# Patient Record
Sex: Male | Born: 2000 | Race: Black or African American | Hispanic: No | Marital: Single | Smoking: Never smoker
Health system: Southern US, Community
[De-identification: ages and names within clinical notes are randomized; demographics above are authoritative.]

## PROBLEM LIST (undated history)

## (undated) DIAGNOSIS — Z789 Other specified health status: Secondary | ICD-10-CM

---
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
## 2024-01-04 ENCOUNTER — Ambulatory Visit (HOSPITAL_COMMUNITY)
Admission: EM | Admit: 2024-01-04 | Discharge: 2024-01-05 | Disposition: A | Discharge status: Other Acute Inpatient Hospital | Attending: Urology | Admitting: Urology

## 2024-01-04 DIAGNOSIS — R45851 Suicidal ideations: Secondary | ICD-10-CM | POA: Diagnosis not present

## 2024-01-04 DIAGNOSIS — F600 Paranoid personality disorder: Secondary | ICD-10-CM | POA: Diagnosis not present

## 2024-01-04 DIAGNOSIS — F32A Depression, unspecified: Secondary | ICD-10-CM | POA: Insufficient documentation

## 2024-01-04 DIAGNOSIS — F22 Delusional disorders: Secondary | ICD-10-CM | POA: Diagnosis not present

## 2024-01-04 DIAGNOSIS — F332 Major depressive disorder, recurrent severe without psychotic features: Secondary | ICD-10-CM | POA: Diagnosis present

## 2024-01-04 MED ORDER — HALOPERIDOL LACTATE 5 MG/ML IJ SOLN: 5.0000 mg | Freq: Three times a day (TID) | INTRAMUSCULAR | Status: DC | PRN

## 2024-01-04 MED ORDER — ACETAMINOPHEN 325 MG PO TABS: 650.0000 mg | ORAL_TABLET | Freq: Four times a day (QID) | ORAL | Status: DC | PRN

## 2024-01-04 MED ORDER — LORAZEPAM 2 MG/ML IJ SOLN: 2.0000 mg | Freq: Three times a day (TID) | INTRAMUSCULAR | Status: DC | PRN

## 2024-01-04 MED ORDER — HALOPERIDOL LACTATE 5 MG/ML IJ SOLN: 10.0000 mg | Freq: Three times a day (TID) | INTRAMUSCULAR | Status: DC | PRN

## 2024-01-04 MED ORDER — ALUM & MAG HYDROXIDE-SIMETH 200-200-20 MG/5ML PO SUSP: 30.0000 mL | ORAL | Status: DC | PRN

## 2024-01-04 MED ORDER — DIPHENHYDRAMINE HCL 50 MG/ML IJ SOLN: 50.0000 mg | Freq: Three times a day (TID) | INTRAMUSCULAR | Status: DC | PRN

## 2024-01-04 MED ORDER — DIPHENHYDRAMINE HCL 50 MG PO CAPS: 50.0000 mg | ORAL_CAPSULE | Freq: Three times a day (TID) | ORAL | Status: DC | PRN

## 2024-01-04 MED ORDER — HALOPERIDOL 5 MG PO TABS: 5.0000 mg | ORAL_TABLET | Freq: Three times a day (TID) | ORAL | Status: DC | PRN

## 2024-01-04 MED ORDER — MAGNESIUM HYDROXIDE 400 MG/5ML PO SUSP: 30.0000 mL | Freq: Every day | ORAL | Status: DC | PRN

## 2024-01-04 MED ORDER — TRAZODONE HCL 50 MG PO TABS: 50.0000 mg | ORAL_TABLET | Freq: Every evening | ORAL | Status: DC | PRN

## 2024-01-04 NOTE — ED Notes (Signed)
 Pt is currently sleeping, no distress noted, environmental check complete, will continue to monitor patient for safety.

## 2024-01-04 NOTE — BH Assessment (Signed)
 Comprehensive Clinical Assessment (CCA) Note  01/04/2024 Antonio Dunn [id number]  Chief Complaint:  Chief Complaint  Patient presents with   Depression   Paranoid   Disposition: Per Antonio Ajibola,NP patient is recommended for inpatient admission.  The patient demonstrates the following risk factors for suicide: Chronic risk factors for suicide include: psychiatric disorder of MDD . Acute risk factors for suicide include: family or marital conflict, social withdrawal/isolation, and loss (financial, interpersonal, professional). Protective factors for this patient include: hope for the future. Considering these factors, the overall suicide risk at this point appears to be low. Patient is not appropriate for outpatient follow up.   Antonio Dunn is a 23 year old male who presents to [facility] voluntarily accompanied by her father Antonio Dunn [phone]. Patient reports paranoia feeling like people are watching him, increased depression symptoms and agitation. His father states his friends contacted him due to concerns about the patient's mental health. He states he was sending angry messages, venting to his friends. His father states that the messages may have mentioned passive SI. Patient reports passive SI but denies plans or intent to harm himself. He reports issues related to his mother treating him like a child. He states he was fired from his job, at [employer] office in his apartment, 1 month ago. His father reported a recent break up, but the patient did not want to elaborate on this subject. His father reports a decline in the patient's hygiene, increased irritability, and he has been hyper focusing on rap artists.He reports daily use of marijuana prior to this week, but cannot specify the amount he was using daily. He reports occasional use of alcohol, last occurrence was roughly around February per his report. He reports he was previously seeing a therapist and psychiatrist at his school [employer] and was  prescribed medication but cannot recall the last time he took the medication.   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. 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 He identifies his primary support system as his father. Patient reports isolation, crying spells, irritability, hopelessness, guilt, loss of interest to do things they enjoy, fatigue, lack of concentration,lack of motivation, worthlessness, decreased sleep, decreased appetite.Patient denies past inpatient admissions. Patient reports upcoming court date for his suspended license but did not elaborate further. Patient denies access to weapons.He denies past suicide attempts. Patient denies NSSIB, HI, and AVH   Treatment options were discussed and patient is in agreement with recommendation for inpatient admission.   Visit Diagnosis: MDD    CCA Screening, Triage and Referral (STR)  Patient Reported Information How did you hear about [place]? Family/Friend  What Is the Reason for Your Visit/Call Today? Antonio Dunn is a 23 year old male who presents to [facility] voluntarily accompanied by her father Antonio Dunn [phone]. Patient reports paranoia feeling like people are watching him, increased depression symptoms and agitation. His father states his friends contacted him due to concerns about the patients mental health. He states he was sending angry messages,venting to his friends. His father states that the messages may have mentioned passive SI. Patient reports passive SI but denies plans or intent to harm himself. Pt denies HI and AVH.  How Long Has This Been Causing You Problems? 1 wk - 1 month  What Do You Feel Would Help You the Most Today? Medication(s); Treatment for Depression or other mood problem; Stress Management   Have You Recently Had Any Thoughts About Hurting Yourself? Yes  Are You  Planning to Commit Suicide/Harm Yourself At This time? No   Flowsheet Row ED from 01/04/2024 in  [facility]  C-SSRS RISK CATEGORY Low Risk       Have you Recently Had Thoughts About Hurting Someone [name]? No  Are You Planning to Harm Someone at This Time? No  Explanation: denies HI   Have You Used Any Alcohol or Drugs in the Past 24 Hours? No  How Long Ago Did You Use Drugs or Alcohol? unknown What Did You Use and How Much? denies use in the past 24 hours   Do You Currently Have a Therapist/Psychiatrist? No  Name of Therapist/Psychiatrist:    Have You Been Recently Discharged From Any Office Practice or Programs? No  Explanation of Discharge From Practice/Program: n/a    CCA Screening Triage Referral Assessment Type of Contact: Face-to-Face  Telemedicine Service Delivery:   Is this Initial or Reassessment?   Date Telepsych consult ordered in [facility]:    Time Telepsych consult ordered in [facility]:    Location of Assessment: [facility] Assessment Services  Provider Location: [facility] Assessment Services   Collateral Involvement: Antonio Dunn, Antonio Dunn (Father)  [phone]   Does Patient Have a Court Appointed Legal Guardian? No  Legal Guardian Contact Information: n/a  Copy of Legal Guardianship Form: -- (n/a)  Legal Guardian Notified of Arrival: -- (n/a)  Legal Guardian Notified of Pending Discharge: -- (n/a)  If Minor and Not Living with Parent(s), Who has Custody? n/a  Is CPS involved or ever been involved? Never  Is APS involved or ever been involved? Never   Patient Determined To Be At Risk for Harm To Self or Others Based on Review of Patient Reported Information or Presenting Complaint? Yes, for Self-Harm  Method: No Plan  Availability of Means: No access or NA  Intent: Vague intent or NA  Notification Required: No need or identified person  Additional Information for Danger to Others Potential: -- (n/a)  Additional Comments for Danger to Others Potential: denies HI  Are There Guns or Other Weapons in Your Home? No  Types of  Guns/Weapons: denies access to weapons  Are These Weapons Safely Secured?                            -- (denies access to weapons)  Who Could Verify You Are Able To Have These Secured: n/a  Do You Have any Outstanding Charges, Pending Court Dates, Parole/Probation? reports upcoming court date for suspended license  Contacted To Inform of Risk of Harm To Self or Others: Family/Significant Other:    Does Patient Present under Involuntary Commitment? No    County of Residence: Other (Comment) (lives in [place])   Patient Currently Receiving the Following Services: Not Receiving Services   Determination of Need: Urgent (48 hours)   Options For Referral: Inpatient Hospitalization     CCA Biopsychosocial Patient Reported Schizophrenia/Schizoaffective Diagnosis in Past: No   Strengths: Seeking treatment   Mental Health Symptoms Depression:  Difficulty Concentrating; Fatigue; Hopelessness; Increase/decrease in appetite; Irritability; Sleep (too much or little); Tearfulness; Worthlessness; Change in energy/activity   Duration of Depressive symptoms: Duration of Depressive Symptoms: Greater than two weeks   Mania:  Irritability   Anxiety:   Tension; Worrying; Sleep   Psychosis:  None   Duration of Psychotic symptoms:    Trauma:  N/A   Obsessions:  N/A   Compulsions:  N/A   Inattention:  N/A   Hyperactivity/Impulsivity:  N/A   Oppositional/Defiant Behaviors:  N/A   Emotional Irregularity:  Mood lability; Transient, stress-related paranoia/disassociation; Intense/inappropriate anger   Other Mood/Personality Symptoms:  none reported    Mental Status Exam Appearance and self-care  Stature:  Average   Weight:  Average weight   Clothing:  Casual   Grooming:  Normal   Cosmetic use:  None   Posture/gait:  Stooped; Tense   Motor activity:  Not Remarkable   Sensorium  Attention:  Normal   Concentration:  Variable   Orientation:  Person; Place;  Situation   Recall/memory:  Normal   Affect and Mood  Affect:  Depressed; Anxious   Mood:  Irritable; Depressed   Relating  Eye contact:  Avoided   Facial expression:  Angry; Tense   Attitude toward examiner:  Guarded; Resistant   Thought and Language  Speech flow: Clear and Coherent   Thought content:  Appropriate to Mood and Circumstances   Preoccupation:  None   Hallucinations:  None   Organization:  Goal-directed   Company secretary of Knowledge:  Average   Intelligence:  Average   Abstraction:  Normal   Judgement:  Impaired   Reality Testing:  Variable   Insight:  Fair   Decision Making:  Impulsive   Social Functioning  Social Maturity:  Isolates   Social Judgement:  Normal   Stress  Stressors:  Family conflict; Relationship; Transitions   Coping Ability:  Exhausted; Overwhelmed   Skill Deficits:  Interpersonal; Self-control   Supports:  Family; Support needed     Religion: Religion/Spirituality Are You A Religious Person?:  (unknown) How Might This Affect Treatment?: UTA  Leisure/Recreation: Leisure / Recreation Do You Have Hobbies?:  (UTA)  Exercise/Diet: Exercise/Diet Do You Exercise?:  (UTA) Have You Gained or Lost A Significant Amount of Weight in the Past Six Months?:  (UTA) Do You Follow a Special Diet?:  (UTA) Do You Have Any Trouble Sleeping?: Yes Explanation of Sleeping Difficulties: reports decreased sleep, reporting not sleeping much at all but cannot quantify the amount of hours per night   CCA Employment/Education Employment/Work Situation: Employment / Work Situation Employment Situation: Employed 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 by Current Illness: Yes Describe how Patient's Job has Been Impacted: reports losing a job 1 month ago Has Patient ever Been in Equities trader?: No  Education: Education Is Patient Currently Attending School?: Yes School Currently Attending: [employer] Last Grade Completed: 12 Did You Product manager?: Yes What Type of College Degree Do you Have?: currently at [employer] Did You Have An Individualized Education Program (IIEP):  (UTA) Did You Have Any Difficulty At School?:  (UTA) Patient's Education Has Been Impacted by Current Illness:  (UTA)   CCA Family/Childhood History Family and Relationship History: Family history Marital status: Single Does patient have children?:  (UTA)  Childhood History:  Childhood History By whom was/is the patient raised?: Father Did patient suffer any verbal/emotional/physical/sexual abuse as a child?: No Did patient suffer from severe childhood neglect?: No Has patient ever been sexually abused/assaulted/raped as an adolescent or adult?: No Was the patient ever a victim of a crime or a disaster?: No Witnessed domestic violence?: No Has patient been affected by domestic violence as an adult?: No       CCA Substance Use Alcohol/Drug Use: Alcohol / Drug Use Pain Medications: UTA Prescriptions: UTA Over the Counter: UTA History of alcohol / drug use?: Yes Longest period of sobriety (when/how long): UTA, reports marijuana use and  occasional alcohol use.                         ASAM's:  Six Dimensions of Multidimensional Assessment  Dimension 1:  Acute Intoxication and/or Withdrawal Potential:      Dimension 2:  Biomedical Conditions and Complications:      Dimension 3:  Emotional, Behavioral, or Cognitive Conditions and Complications:     Dimension 4:  Readiness to Change:     Dimension 5:  Relapse, Continued use, or Continued Problem Potential:     Dimension 6:  Recovery/Living Environment:     ASAM Severity Score:    ASAM Recommended Level of Treatment:     Substance use Disorder (SUD)    Recommendations for Services/Supports/Treatments:    Disposition Recommendation per psychiatric provider: We recommend inpatient psychiatric hospitalization when  medically cleared. Patient is under voluntary admission status at this time; please IVC if attempts to leave hospital.   DSM5 Diagnoses: There are no active problems to display for this patient.    Referrals to Alternative Service(s): Referred to Alternative Service(s):   Place:   Date:   Time:    Referred to Alternative Service(s):   Place:   Date:   Time:    Referred to Alternative Service(s):   Place:   Date:   Time:    Referred to Alternative Service(s):   Place:   Date:   Time:     Loma Newton, [facility]

## 2024-01-04 NOTE — ED Provider Notes (Signed)
 [facility] Continuous Assessment Admission H&P  Date: 01/04/24 Patient Name: Antonio Dunn MRN: [id number] Chief Complaint: "depression and passive suicidal thoughts"  Diagnoses:  Final diagnoses:  None    HPI: Antonio Dunn is a 23y/o male with documented history of depression and ADHD. Patient presented voluntarily to [facility] for the evaluation of worsening depressive symptoms. He is accompanied by his father, Kingston Shawgo. Patient gave verbal consent for his father to participate in his assessment.   Patient was evaluated face to face and his chart was reviewed by this NP.  On assessment, patient acknowledges increased irritability and emotional distress. The father reports the patient has been acting out of character, with recent reports of sending angry text messages, acting paranoid, passive suicidal ideation, and thoughts of hurting others. Father states "he doesn't care if he wakes up."  Patient admits to suicidal thoughts but denies any intent or plan to harm himself. Key stressors include a recent breakup, being fired from his job, and strained relations with his mother. The father notes a decline in hygiene, increased irritability, and an obsessive focus on rap artists. The patient reports daily marijuana use up until early this week and occasional alcohol use, with his last drink occurring in mid-February.  Patient reports he is experiencing symptoms of depression, including isolation, crying spells, hopelessness, guilt, loss of interest in activities, fatigue, lack of concentration, worthlessness, decreased sleep, and appetite changes. He says he is currently not seeing a therapist or psychiatrist but was previously prescribed Lexapro and Vyvanse while during his undergrad years at [employer], although he cannot recall the last time he took the medications.  On assessment, the patient was cooperative but irritable, with a restricted affect congruent with his mood. His thought process was  coherent, and he endorsed passive suicidal ideation without plan or intent. He is oriented X4. He reports difficulties with concentration and motivation. His insight is faired, and his judgment is impaired. He denies hallucination. He admits to feeling paranoid- stating "I feel like I'm been watched every where in go." No evidence of delusional thinking noted.    Total Time spent with patient: 30 minutes  Musculoskeletal  Strength & Muscle Tone: within normal limits Gait & Station: normal Patient leans: Right  Psychiatric Specialty Exam  Presentation General Appearance:  Appropriate for Environment  Eye Contact: Fair  Speech: Clear and Coherent  Speech Volume: Decreased  Handedness: Right   Mood and Affect  Mood: Depressed  Affect: Congruent   Thought Process  Thought Processes: Coherent  Descriptions of Associations:Intact  Orientation:Full (Time, Place and Person)  Thought Content:WDL    Hallucinations:Hallucinations: None  Ideas of Reference:None  Suicidal Thoughts:Suicidal Thoughts: Yes, Passive SI Passive Intent and/or Plan: Without Plan  Homicidal Thoughts:Homicidal Thoughts: No   Sensorium  Memory: Immediate Good; Remote Good; Recent Good  Judgment: Fair  Insight: Fair   Art therapist  Concentration: Good  Attention Span: Good  Recall: Good  Fund of Knowledge: Good  Language: Good   Psychomotor Activity  Psychomotor Activity: Psychomotor Activity: Normal   Assets  Assets: Desire for Improvement; Housing; Social Support; Physical Health   Sleep  Sleep: Sleep: Poor ("just a few hours, i dont sleep") Number of Hours of Sleep: 0 ("just a few hours" pt unable to quantify)   Nutritional Assessment (For OBS and FBC admissions only) Has the patient had a weight loss or gain of 10 pounds or more in the last 3 months?: No Has the patient had a decrease in food intake/or  appetite?: Yes Does the patient have dental  problems?: No Does the patient have eating habits or behaviors that may be indicators of an eating disorder including binging or inducing vomiting?: No Has the patient recently lost weight without trying?: 0 Has the patient been eating poorly because of a decreased appetite?: 0 Malnutrition Screening Tool Score: 0    Physical Exam Vitals and nursing note reviewed.  Constitutional:      General: He is not in acute distress.    Appearance: Normal appearance. He is well-developed. He is not ill-appearing.  HENT:     Head: Normocephalic and atraumatic.  Eyes:     Conjunctiva/sclera: Conjunctivae normal.  Cardiovascular:     Rate and Rhythm: Tachycardia present.     Heart sounds: No murmur heard. Pulmonary:     Effort: Pulmonary effort is normal. No respiratory distress.  Abdominal:     Palpations: Abdomen is soft.     Tenderness: There is no abdominal tenderness.  Musculoskeletal:        General: Normal range of motion.     Cervical back: Neck supple.  Neurological:     Mental Status: He is alert and oriented to person, place, and time.  Psychiatric:        Attention and Perception: Attention and perception normal.        Mood and Affect: Affect normal. Mood is depressed.        Speech: Speech normal.        Behavior: Behavior is cooperative.        Thought Content: Thought content is paranoid. Thought content is not delusional. Thought content includes suicidal ideation. Thought content does not include suicidal plan.    Review of Systems  Constitutional: Negative.   HENT: Negative.    Eyes: Negative.   Respiratory: Negative.    Cardiovascular: Negative.   Gastrointestinal: Negative.   Genitourinary: Negative.   Musculoskeletal: Negative.   Skin: Negative.   Neurological: Negative.   Endo/Heme/Allergies: Negative.   Psychiatric/Behavioral:  Positive for depression, substance abuse and suicidal ideas.     Blood pressure 131/87, pulse (!) 113, temperature 97.8 F (36.6  C), temperature source Oral, resp. rate 18, SpO2 100%. There is no height or weight on file to calculate BMI.  Past Psychiatric History: Depression ADHD   Is the patient at risk to self? No  Has the patient been a risk to self in the past 6 months? No .    Has the patient been a risk to self within the distant past? No   Is the patient a risk to others? No   Has the patient been a risk to others in the past 6 months? No   Has the patient been a risk to others within the distant past? No   Past Medical History: tonsillitis, periorbital cellulist,   Family History: None reported  Social History:  Social History   Tobacco Use   Smoking status: Never   Smokeless tobacco: Never  Substance Use Topics   Alcohol use: Not Currently   Drug use: Not Currently     Last Labs:  No visits with results within 6 Month(s) from this visit.  Latest known visit with results is:  Hospital Outpatient Visit on 06/09/2009  Component Date Value Ref Range Status   WBC 06/09/2009 9.4  4.5 - 13.5 K/uL Final   RBC 06/09/2009 4.65  3.80 - 5.20 MIL/uL Final   Hemoglobin 06/09/2009 13.3  11.0 - 14.6 g/dL Final   HCT 40/98/1191  39.8  33.0 - 44.0 % Final   MCV 06/09/2009 85.5  77.0 - 95.0 fL Final   MCHC 06/09/2009 33.5  31.0 - 37.0 g/dL Final   RDW 97/94/8016 13.4  11.3 - 15.5 % Final   Platelets 06/09/2009 340  150 - 400 K/uL Final   Sodium 06/09/2009 134 (L)  135 - 145 mEq/L Final   Potassium 06/09/2009 3.9  3.5 - 5.1 mEq/L Final   Chloride 06/09/2009 101  96 - 112 mEq/L Final   CO2 06/09/2009 27  19 - 32 mEq/L Final   Glucose, Bld 06/09/2009 88  70 - 99 mg/dL Final   BUN 55/37/4827 12  6 - 23 mg/dL Final   Creatinine, Ser 06/09/2009 0.42  0.4 - 1.5 mg/dL Final   Calcium 07/86/7544 9.2  8.4 - 10.5 mg/dL Final   Total Protein 92/10/69 7.2  6.0 - 8.3 g/dL Final   Albumin 21/97/5883 4.1  3.5 - 5.2 g/dL Final   AST 25/49/8264 36  0 - 37 U/L Final   ALT 06/09/2009 44  0 - 53 U/L Final   Alkaline  Phosphatase 06/09/2009 376 (H)  42 - 362 U/L Final   Total Bilirubin 06/09/2009 0.4  0.3 - 1.2 mg/dL Final   GFR calc non Af Amer 06/09/2009 NOT CALCULATED  >60 mL/min Final   GFR calc Af Amer 06/09/2009   >60 mL/min Final                   Value:NOT CALCULATED                                The eGFR has been calculated                         using the MDRD equation.                         This calculation has not been                         validated in all clinical                         situations.                         eGFR's persistently                         <60 mL/min signify                         possible Chronic Kidney Disease.   Neutrophils Relative % 06/09/2009 64  33 - 67 % Final   Lymphocytes Relative 06/09/2009 21 (L)  31 - 63 % Final   Monocytes Relative 06/09/2009 9  3 - 11 % Final   Eosinophils Relative 06/09/2009 5  0 - 5 % Final   Basophils Relative 06/09/2009 1  0 - 1 % Final   Neutro Abs 06/09/2009 6.0  1.5 - 8.0 K/uL Final   Lymphs Abs 06/09/2009 2.0  1.5 - 7.5 K/uL Final   Monocytes Absolute 06/09/2009 0.8  0.2 - 1.2 K/uL Final   Eosinophils Absolute 06/09/2009 0.5  0.0 - 1.2 K/uL Final   Basophils Absolute  06/09/2009 0.1  0.0 - 0.1 K/uL Final   Smear Review 06/09/2009 MORPHOLOGY UNREMARKABLE   Final   Lipase 06/09/2009 18  11 - 59 U/L Final    Allergies: Patient has no known allergies.  Medications:  Facility Ordered Medications  Medication   acetaminophen (TYLENOL) tablet 650 mg   alum & mag hydroxide-simeth (MAALOX/MYLANTA) 200-200-20 MG/5ML suspension 30 mL   magnesium hydroxide (MILK OF MAGNESIA) suspension 30 mL   haloperidol lactate (HALDOL) injection 5 mg   And   diphenhydrAMINE (BENADRYL) injection 50 mg   And   LORazepam (ATIVAN) injection 2 mg   haloperidol (HALDOL) tablet 5 mg   And   diphenhydrAMINE (BENADRYL) capsule 50 mg   haloperidol lactate (HALDOL) injection 10 mg   And   diphenhydrAMINE (BENADRYL) injection 50 mg   And    LORazepam (ATIVAN) injection 2 mg   traZODone (DESYREL) tablet 50 mg   PTA Medications  Medication Sig   ibuprofen (ADVIL,MOTRIN) 600 MG tablet Take 1 tablet (600 mg total) by mouth every 6 (six) hours as needed.      Medical Decision Making  Patient is recommended for inpatient treatment for stabilization and safety.     Recommendations  Based on my evaluation the patient does not appear to have an emergency medical condition.  Maricela Bo, NP 01/04/24  9:29 PM

## 2024-01-04 NOTE — Progress Notes (Signed)
   01/04/24 1821  [facility] Triage Screening (Walk-ins at [facility] only)  How Did You Hear About [place]? Self  What Is the Reason for Your Visit/Call Today? Pt is a 23 yo male who presents to [facility] voluntarily accompanied by his father Kandee Keen [phone]). Pt currently lives in [place], [place] but drove to his father today so that he can be evaluated due to fahter's concerns about pt's  mental health. Pt present paranoid and shared that he recently got fired from his job because he became uncomfortable working there. Pt reports seeing "unfamiliar faces". Pt reports that he has been diagnosed with depression in the past and was prescribed medications. Pt reports that he stopped recently because he didn't like how it was making him feel. Pt reports passive SI and not wanting to live but denies a plan. Pt's father expressed concern about pt's behaviors and stated that pt's friends have reached out to him about pt's SI comments. Pt reports that he currently smokes marijuana but has cut back. Pt was unable to recall how much he smokes.  How Long Has This Been Causing You Problems? > than 6 months  Have You Recently Had Any Thoughts About Hurting Yourself? Yes  How long ago did you have thoughts about hurting yourself? "all my life"  Are You Planning to Commit Suicide/Harm Yourself At This time? No  Have you Recently Had Thoughts About Hurting Someone [name]? No  Are You Planning To Harm Someone At This Time? No  Physical Abuse Denies  Verbal Abuse Denies  Sexual Abuse Denies  Exploitation of patient/patient's resources Denies  Self-Neglect Denies  Possible abuse reported to:  (n/a)  Are you currently experiencing any auditory, visual or other hallucinations? Yes  Please explain the hallucinations you are currently experiencing: " seeing unfamilar faces"  Have You Used Any Alcohol or Drugs in the Past 24 Hours? Yes  What Did You Use and How Much? marijuana  Do you have any current medical co-morbidities that  require immediate attention? No  Clinician description of patient physical appearance/behavior: Pt was cooperative  What Do You Feel Would Help You the Most Today? Medication(s);Treatment for Depression or other mood problem;Stress Management  If access to [facility] was not available, would you have sought care in the Emergency Department? Yes  Determination of Need Urgent (48 hours)  Options For Referral Inpatient Hospitalization  Determination of Need filed? Yes    Flowsheet Row ED from 01/04/2024 in [facility]  C-SSRS RISK CATEGORY Low Risk

## 2024-01-05 ENCOUNTER — Encounter (HOSPITAL_COMMUNITY): Payer: Self-pay | Admitting: Urology

## 2024-01-05 ENCOUNTER — Other Ambulatory Visit: Payer: Self-pay

## 2024-01-05 ENCOUNTER — Inpatient Hospital Stay (HOSPITAL_COMMUNITY)
Admission: AD | Admit: 2024-01-05 | Discharge: 2024-01-08 | DRG: 885 | Disposition: A | Discharge status: Home / Self Care | Source: Intra-hospital | Attending: Psychiatry | Admitting: Psychiatry

## 2024-01-05 DIAGNOSIS — Z5986 Financial insecurity: Secondary | ICD-10-CM

## 2024-01-05 DIAGNOSIS — F333 Major depressive disorder, recurrent, severe with psychotic symptoms: Secondary | ICD-10-CM | POA: Diagnosis present

## 2024-01-05 DIAGNOSIS — F419 Anxiety disorder, unspecified: Secondary | ICD-10-CM | POA: Insufficient documentation

## 2024-01-05 DIAGNOSIS — L309 Dermatitis, unspecified: Secondary | ICD-10-CM | POA: Diagnosis present

## 2024-01-05 DIAGNOSIS — Z91199 Patient's noncompliance with other medical treatment and regimen due to unspecified reason: Secondary | ICD-10-CM

## 2024-01-05 DIAGNOSIS — F909 Attention-deficit hyperactivity disorder, unspecified type: Secondary | ICD-10-CM | POA: Diagnosis present

## 2024-01-05 DIAGNOSIS — Z79899 Other long term (current) drug therapy: Secondary | ICD-10-CM

## 2024-01-05 HISTORY — DX: Other specified health status: Z78.9

## 2024-01-05 LAB — CBC WITH DIFFERENTIAL/PLATELET
Abs Immature Granulocytes: 0.04 10*3/uL (ref 0.00–0.07)
Basophils Absolute: 0 10*3/uL (ref 0.0–0.1)
Basophils Relative: 0 %
Eosinophils Absolute: 0.2 10*3/uL (ref 0.0–0.5)
Eosinophils Relative: 2 %
HCT: 46.4 % (ref 39.0–52.0)
Hemoglobin: 16.4 g/dL (ref 13.0–17.0)
Immature Granulocytes: 0 %
Lymphocytes Relative: 24 %
Lymphs Abs: 2.3 10*3/uL (ref 0.7–4.0)
MCH: 29.9 pg (ref 26.0–34.0)
MCHC: 35.3 g/dL (ref 30.0–36.0)
MCV: 84.7 fL (ref 80.0–100.0)
Monocytes Absolute: 0.8 10*3/uL (ref 0.1–1.0)
Monocytes Relative: 9 %
Neutro Abs: 6.1 10*3/uL (ref 1.7–7.7)
Neutrophils Relative %: 65 %
Platelets: 362 10*3/uL (ref 150–400)
RBC: 5.48 MIL/uL (ref 4.22–5.81)
RDW: 12.1 % (ref 11.5–15.5)
WBC: 9.5 10*3/uL (ref 4.0–10.5)
nRBC: 0 % (ref 0.0–0.2)

## 2024-01-05 LAB — LIPID PANEL
Cholesterol: 132 mg/dL (ref 0–200)
HDL: 49 mg/dL (ref >40)
LDL Cholesterol: 75 mg/dL (ref 0–99)
Total CHOL/HDL Ratio: 2.7 ratio
Triglycerides: 42 mg/dL (ref <150)
VLDL: 8 mg/dL (ref 0–40)

## 2024-01-05 LAB — COMPREHENSIVE METABOLIC PANEL WITH GFR
ALT: 31 U/L (ref 0–44)
AST: 33 U/L (ref 15–41)
Albumin: 4.4 g/dL (ref 3.5–5.0)
Alkaline Phosphatase: 79 U/L (ref 38–126)
Anion gap: 11 (ref 5–15)
BUN: 7 mg/dL (ref 6–20)
CO2: 29 mmol/L (ref 22–32)
Calcium: 9.7 mg/dL (ref 8.9–10.3)
Chloride: 98 mmol/L (ref 98–111)
Creatinine, Ser: 1.25 mg/dL — ABNORMAL HIGH (ref 0.61–1.24)
GFR, Estimated: >60 mL/min (ref >60)
Glucose, Bld: 77 mg/dL (ref 70–99)
Potassium: 3.7 mmol/L (ref 3.5–5.1)
Sodium: 138 mmol/L (ref 135–145)
Total Bilirubin: 0.8 mg/dL (ref 0.0–1.2)
Total Protein: 7.7 g/dL (ref 6.5–8.1)

## 2024-01-05 LAB — ETHANOL: Alcohol, Ethyl (B): <10 mg/dL (ref <10)

## 2024-01-05 LAB — HEMOGLOBIN A1C
Hgb A1c MFr Bld: 5 % (ref 4.8–5.6)
Mean Plasma Glucose: 96.8 mg/dL

## 2024-01-05 MED ORDER — INFLUENZA VIRUS VACC SPLIT PF (FLUZONE) 0.5 ML IM SUSY
0.5000 mL | PREFILLED_SYRINGE | INTRAMUSCULAR | Status: DC
  Filled 2024-01-05: qty 0.5

## 2024-01-05 MED ORDER — HALOPERIDOL LACTATE 5 MG/ML IJ SOLN: 5.0000 mg | Freq: Three times a day (TID) | INTRAMUSCULAR | Status: DC | PRN

## 2024-01-05 MED ORDER — FLUOXETINE HCL 10 MG PO CAPS
10.0000 mg | ORAL_CAPSULE | Freq: Every day | ORAL | Status: AC
  Administered 2024-01-05: 10 mg via ORAL
  Filled 2024-01-05 (×2): qty 1

## 2024-01-05 MED ORDER — ACETAMINOPHEN 325 MG PO TABS
650.0000 mg | ORAL_TABLET | Freq: Four times a day (QID) | ORAL | Status: DC | PRN
  Administered 2024-01-06 – 2024-01-08 (×4): 650 mg via ORAL
  Filled 2024-01-05 (×5): qty 2

## 2024-01-05 MED ORDER — MAGNESIUM HYDROXIDE 400 MG/5ML PO SUSP: 30.0000 mL | Freq: Every day | ORAL | Status: DC | PRN

## 2024-01-05 MED ORDER — HYDROXYZINE HCL 25 MG PO TABS: 25.0000 mg | ORAL_TABLET | Freq: Three times a day (TID) | ORAL | Status: DC | PRN

## 2024-01-05 MED ORDER — DIPHENHYDRAMINE HCL 50 MG/ML IJ SOLN: 50.0000 mg | Freq: Three times a day (TID) | INTRAMUSCULAR | Status: DC | PRN

## 2024-01-05 MED ORDER — ALUM & MAG HYDROXIDE-SIMETH 200-200-20 MG/5ML PO SUSP: 30.0000 mL | ORAL | Status: DC | PRN

## 2024-01-05 MED ORDER — DIPHENHYDRAMINE HCL 25 MG PO CAPS: 50.0000 mg | ORAL_CAPSULE | Freq: Three times a day (TID) | ORAL | Status: DC | PRN

## 2024-01-05 MED ORDER — LORAZEPAM 2 MG/ML IJ SOLN: 2.0000 mg | Freq: Three times a day (TID) | INTRAMUSCULAR | Status: DC | PRN

## 2024-01-05 MED ORDER — ARIPIPRAZOLE 5 MG PO TABS
5.0000 mg | ORAL_TABLET | Freq: Every day | ORAL | Status: DC
  Administered 2024-01-05 – 2024-01-08 (×4): 5 mg via ORAL
  Filled 2024-01-05 (×7): qty 1

## 2024-01-05 MED ORDER — HALOPERIDOL 5 MG PO TABS: 5.0000 mg | ORAL_TABLET | Freq: Three times a day (TID) | ORAL | Status: DC | PRN

## 2024-01-05 MED ORDER — FLUOXETINE HCL 20 MG PO CAPS
20.0000 mg | ORAL_CAPSULE | Freq: Every day | ORAL | Status: AC
  Administered 2024-01-06: 20 mg via ORAL
  Filled 2024-01-05: qty 1

## 2024-01-05 MED ORDER — TRAZODONE HCL 50 MG PO TABS
50.0000 mg | ORAL_TABLET | Freq: Every evening | ORAL | Status: DC | PRN
  Administered 2024-01-05 – 2024-01-07 (×3): 50 mg via ORAL
  Filled 2024-01-05 (×3): qty 1

## 2024-01-05 MED ORDER — NICOTINE POLACRILEX 2 MG MT GUM
2.0000 mg | CHEWING_GUM | OROMUCOSAL | Status: DC | PRN
  Administered 2024-01-05 – 2024-01-08 (×9): 2 mg via ORAL
  Filled 2024-01-05: qty 1

## 2024-01-05 MED ORDER — HALOPERIDOL LACTATE 5 MG/ML IJ SOLN: 10.0000 mg | Freq: Three times a day (TID) | INTRAMUSCULAR | Status: DC | PRN

## 2024-01-05 NOTE — [facility]HH Group Notes (Signed)
 Adult Psychoeducational Group Note  Date:  01/05/2024 Time:  9:16 PM  Group Topic/Focus:  Wrap-Up Group:   The focus of this group is to help patients review their daily goal of treatment and discuss progress on daily workbooks.  Participation Level:  Active  Participation Quality:  Appropriate  Affect:  Appropriate  Cognitive:  Appropriate  Insight: Appropriate  Engagement in Group:  Engaged  Modes of Intervention:  Discussion  Additional Comments:  Pt attended group.  Joselyn Arrow 01/05/2024, 9:16 PM

## 2024-01-05 NOTE — Progress Notes (Signed)
   01/05/24 0900  Psych Admission Type (Psych Patients Only)  Admission Status Voluntary  Psychosocial Assessment  Patient Complaints Depression;Irritability  Eye Contact Fair  Facial Expression Flat  Affect Irritable  Speech Logical/coherent  Interaction Assertive  Motor Activity Other (Comment) (WNL)  Appearance/Hygiene In scrubs  Behavior Characteristics Anxious  Mood Depressed  Thought Process  Coherency WDL  Content WDL  Delusions None reported or observed  Perception WDL  Hallucination None reported or observed  Judgment Limited  Confusion None  Danger to Self  Current suicidal ideation? Denies  Description of Suicide Plan n  Agreement Not to Harm Self Yes  Description of Agreement verbal  Danger to Others  Danger to Others None reported or observed

## 2024-01-05 NOTE — Tx Team (Signed)
 Initial Treatment Plan 01/05/2024 4:44 AM Antonio Dunn [id number]    PATIENT STRESSORS: Financial difficulties   Marital or family conflict     PATIENT STRENGTHS: Average or above average intelligence  General fund of knowledge  Supportive family/friends    PATIENT IDENTIFIED PROBLEMS: Recent Breakup  Job Loss  School                 DISCHARGE CRITERIA:  Ability to meet basic life and health needs Improved stabilization in mood, thinking, and/or behavior Need for constant or close observation no longer present Verbal commitment to aftercare and medication compliance  PRELIMINARY DISCHARGE PLAN: Outpatient therapy Return to previous living arrangement Return to previous work or school arrangements  PATIENT/FAMILY INVOLVEMENT: This treatment plan has been presented to and reviewed with the patient, Antonio Dunn. The patient and family have been given the opportunity to ask questions and make suggestions.  Gara Kroner, RN 01/05/2024, 4:44 AM

## 2024-01-05 NOTE — Group Note (Signed)
 Date:  01/05/2024 Time:  9:04 AM  Group Topic/Focus:  Goals Group:   The focus of this group is to help patients establish daily goals to achieve during treatment and discuss how the patient can incorporate goal setting into their daily lives to aide in recovery. Orientation:   The focus of this group is to educate the patient on the purpose and policies of crisis stabilization and provide a format to answer questions about their admission.  The group details unit policies and expectations of patients while admitted.    Participation Level:  Did Not Attend  Participation Quality:  n/a  Affect:  n/a  Cognitive:  n/a  Insight: None  Engagement in Group:  n/a  Modes of Intervention:  n/a  Additional Comments:  n/a  Antonio Dunn Antonio Dunn 01/05/2024, 9:04 AM

## 2024-01-05 NOTE — Plan of Care (Signed)
   Problem: Education: Goal: Knowledge of [facility] Education information/materials will improve Outcome: Progressing Goal: Emotional status will improve Outcome: Progressing Goal: Mental status will improve Outcome: Progressing Goal: Verbalization of understanding the information provided will improve Outcome: Progressing

## 2024-01-05 NOTE — Progress Notes (Signed)
 Patient ID: Antonio Dunn, male   DOB: 04/02/2001, 23 y.o.   MRN: [id number]  Patient admitted to [facility] from [facility]. He presented with SI no Plan, increased paranoia, feeling like people are watching him, increased depression and agitation. His stressors include a recent breakup and quitting his job. In addition, he is a IT consultant at [employer]. He is A/O x 4, ambulatory. He denies SI/HI/AVH/Pain. On admission, patient is anxious, slightly agitated, and depressed. He has a flat affect; interaction is minimal. Admission process complete. [facility]/Consent forms signed, patient verbalized understanding. Non invasive search/skin assessment complete; skin intact. A tattoo to the chest and LUE noted. Patient offered a meal/snack/fluid and declined. Patient oriented to the unit/room. Safety maintained.

## 2024-01-05 NOTE — Group Note (Signed)
 LCSW Group Therapy Note  Group Date: 01/05/2024 Start Time: 1000 End Time: 1100   Type of Therapy and Topic:  Group Therapy: Positive Affirmations  Participation Level:  Did Not Attend   Description of Group:   This group addressed positive affirmation towards self and others.  Patients went around the room and identified two positive things about themselves and two positive things about a peer in the room.  Patients reflected on how it felt to share something positive with others, to identify positive things about themselves, and to hear positive things from others/ Patients were encouraged to have a daily reflection of positive characteristics or circumstances.   Therapeutic Goals: Patients will verbalize two of their positive qualities Patients will demonstrate empathy for others by stating two positive qualities about a peer in the group Patients will verbalize their feelings when voicing positive self affirmations and when voicing positive affirmations of others Patients will discuss the potential positive impact on their wellness/recovery of focusing on positive traits of self and others.  Summary of Patient Progress:  Pt was invited, did not attend  Therapeutic Modalities:   Cognitive Behavioral Therapy Motivational Interviewing    Steffanie Dunn, LCSWA 01/05/2024  5:51 PM

## 2024-01-05 NOTE — B[facility] Suicide Risk Assessment (Signed)
 [facility] Admission Suicide Risk Assessment   Nursing information obtained from:  Patient Demographic factors:  Male, Adolescent or young adult Current Mental Status:  NA Loss Factors:  Loss of significant relationship Historical Factors:  Impulsivity Risk Reduction Factors:  Positive social support  Total Time spent with patient: 1 hour Principal Problem: MDD (major depressive disorder), recurrent, severe, with psychosis (HCC) Diagnosis:  Principal Problem:   MDD (major depressive disorder), recurrent, severe, with psychosis (HCC) Active Problems:   Anxiety disorder, unspecified  Subjective Data: See H&P  Continued Clinical Symptoms:  Alcohol Use Disorder Identification Test Final Score (AUDIT): 2 The "Alcohol Use Disorders Identification Test", Guidelines for Use in Primary Care, Second Edition.  World Science writer [facility]). Score between 0-7:  no or low risk or alcohol related problems. Score between 8-15:  moderate risk of alcohol related problems. Score between 16-19:  high risk of alcohol related problems. Score 20 or above:  warrants further diagnostic evaluation for alcohol dependence and treatment.   CLINICAL FACTORS:   Depression:   Anhedonia Hopelessness Impulsivity Insomnia   Musculoskeletal: Strength & Muscle Tone: within normal limits Gait & Station: normal Patient leans: N/A  Psychiatric Specialty Exam:  Presentation  General Appearance:  Appropriate for Environment  Eye Contact: Fair  Speech: Clear and Coherent  Speech Volume: Decreased  Handedness: Right   Mood and Affect  Mood: Depressed  Affect: Congruent   Thought Process  Thought Processes: Coherent  Descriptions of Associations:Intact  Orientation:Full (Time, Place and Person)  Thought Content:WDL  History of Schizophrenia/Schizoaffective disorder:No  Duration of Psychotic Symptoms:No data recorded Hallucinations:Hallucinations: None  Ideas of  Reference:None  Suicidal Thoughts:Suicidal Thoughts: Yes, Passive SI Passive Intent and/or Plan: Without Plan  Homicidal Thoughts:Homicidal Thoughts: No   Sensorium  Memory: Immediate Good; Remote Good; Recent Good  Judgment: Impaired  Insight: Fair   Art therapist  Concentration: Good  Attention Span: Good  Recall: Good  Fund of Knowledge: Good  Language: Good   Psychomotor Activity  Psychomotor Activity: Psychomotor Activity: Normal   Assets  Assets: Desire for Improvement; Housing; Social Support; Physical Health   Sleep  Sleep: Sleep: Poor ("just a few hours, i dont sleep") Number of Hours of Sleep: 0 ("just a few hours" pt unable to quantify)    Physical Exam: Physical Exam ROS Blood pressure 123/89, pulse (!) 58, temperature 98.1 F (36.7 C), temperature source Oral, resp. rate 18, height 5\' 10"  (1.778 m), weight 79.7 kg, SpO2 100%. Body mass index is 25.22 kg/m.   COGNITIVE FEATURES THAT CONTRIBUTE TO RISK:  None    SUICIDE RISK:   Moderate:  Frequent suicidal ideation with limited intensity, and duration, some specificity in terms of plans, no associated intent, good self-control, limited dysphoria/symptomatology, some risk factors present, and identifiable protective factors, including available and accessible social support.  PLAN OF CARE: See H&P  I certify that inpatient services furnished can reasonably be expected to improve the patient's condition.   Rohil Lesch Abbott Pao, MD 01/05/2024, 10:47 AM

## 2024-01-05 NOTE — Plan of Care (Signed)
  Problem: Education: Goal: Knowledge of [facility] Education information/materials will improve Outcome: Progressing Goal: Mental status will improve Outcome: Progressing Goal: Verbalization of understanding the information provided will improve Outcome: Progressing   Problem: Education: Goal: Emotional status will improve Outcome: Not Progressing   Problem: Coping: Goal: Ability to verbalize frustrations and anger appropriately will improve Outcome: Not Progressing

## 2024-01-05 NOTE — H&P (Signed)
 Psychiatric Admission Assessment Adult  Patient Identification: Antonio Dunn MRN:  [id number] Date of Evaluation:  01/05/2024  Chief Complaint:  MDD (major depressive disorder) [F32.9]  History of Present Illness:  Antonio Dunn is a 23 y.o., male with a past psychiatric history significant for depression and anxiety who presents to the [facility] from behavioral health urgent care for evaluation and management of worsening depression, irritability and paranoia.  According to outside records, the patient presented to behavioral health urgent care with his father voluntarily with report of increased paranoia, SI with no plan, worsening depression and agitation.  Initial assessment on 01/05/24, patient was evaluated on the inpatient unit, the patient reports he was diagnosed with depression and anxiety in 2023 by primary care providers and was sent to see a psychiatrist in [place] for the past year with last visit in January where he has been receiving treatment with medications including Lexapro BuSpar Vistaril and Adderall for depression, anxiety and ADHD, he reports taking the medications on and off prior to admission but unsure to determine when was the last dose.  He reports he has been staying in [place] for the past 4 years as he studies at [employer] but drove home "my dad said I need to come home to get some help" he reported his dad was alerted "the way I was acting I had anger and paranoia" he reports feeling uncomfortable around others at work led to him quitting his job and he later notes "I got fired they said they were making changes" he reports no paranoia since here in the hospital.  He reports feeling hopeless and helpless prior to admission with passive SI but denies active SI intention or plan prior to admission or at this time, able to contract for safety in the hospital.  He denies HI or AVH.  He reports feeling depressed on and off "forever since I was a kid" worsening over  the past few weeks related to stress including breaking up from girlfriend of 6 months and also quitting his job or getting fired as noted previously.  He reports decreased sleep and appetite decreased interest and motivation decreased energy and concentration feeling hopeless and helpless on and off prior to admission with passive SI as noted.  He does describe some feeling uncomfortable around others or guardedness and paranoia on and off for about 6 months but worsening recently.  He denies symptoms consistent with mania or hypomania or PTSD.  Chart review: Notes from urgent care were reviewed, no previous psych visits inpatient or outpatient noted [place] [facility] review indicates being prescribed Adderall ER 25 mg once daily and 5 mg immediate release once daily has been on Adderall by the same provider since July 2024 and the prior to that was on Vyvanse.   Psych meds prior to admission:  Patient reports prior to admission was on Lexapro, BuSpar, Vistaril and Adderall but unsure of doses.  He reports he uses Adderall as needed for ADHD when in a school.  He reports he stopped using Lexapro BuSpar and Vistaril but unable to determine last dose.  Sleep Describes poor sleep prior to admission  Collateral information: None at this time, patient provided father's name and number and provided verbal consent to talk to him for collateral information at [phone]  Past Psychiatric History:  Prior Psychiatric diagnoses: Depression, anxiety and ADHD Past Psychiatric Hospitalizations: Denies  History of self mutilation: Denies Past suicide attempts: Denies Past history of HI, violent or aggressive behavior: Denies  Past  Psychiatric medications trials: Lexapro, BuSpar, Vistaril and Adderall History of ECT/TMS: Denies  Outpatient psychiatric Follow up: Seeing a psychiatrist in [place] area for the past year with last visit in January Prior Outpatient Therapy: Was seeing a counselor at the same office  with the psychiatric provider but noncompliant with visits per his report    Is the patient at risk to self? Yes.    Has the patient been a risk to self in the past 6 months? No.  Has the patient been a risk to self within the distant past? No.  Is the patient a risk to others? No.  Has the patient been a risk to others in the past 6 months? No.  Has the patient been a risk to others within the distant past? No.    Substance Use History: Alcohol: Reports drinking alcohol rarely about once a year but denies any symptoms consistent with blackout or withdrawals Tobacoo: Non-smoker Marijuana: Denies Cocaine: Denies Stimulants: Denies IV drug use: Denies Opiates: Denies Prescribed Meds abuse: Denies H/O withdrawals, blackouts, DTs: Denies History of Detox / Rehab: Denies DUI: Denies  Alcohol Screening: 1. How often do you have a drink containing alcohol?: Monthly or less 2. How many drinks containing alcohol do you have on a typical day when you are drinking?: 3 or 4 3. How often do you have six or more drinks on one occasion?: Never AUDIT-C Score: 2 4. How often during the last year have you found that you were not able to stop drinking once you had started?: Never 5. How often during the last year have you failed to do what was normally expected from you because of drinking?: Never 6. How often during the last year have you needed a first drink in the morning to get yourself going after a heavy drinking session?: Never 7. How often during the last year have you had a feeling of guilt of remorse after drinking?: Never 8. How often during the last year have you been unable to remember what happened the night before because you had been drinking?: Never 9. Have you or someone else been injured as a result of your drinking?: No 10. Has a relative or friend or a doctor or another health worker been concerned about your drinking or suggested you cut down?: No Alcohol Use Disorder  Identification Test Final Score (AUDIT): 2 Alcohol Brief Interventions/Follow-up: Alcohol education/Brief advice  Substance Abuse History in the last 12 months:  No.   Tobacco Screening: Non-smoker    Past Medical/Surgical History:  Past Medical History:  Diagnosis Date   Medical history non-contributory    History reviewed. No pertinent surgical history.  Family History: History reviewed. No pertinent family history.  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 Copy service: Denies Legal history: Denies pending charges or court dates Trauma: Denies Access to guns: Denies   Allergies:  No Known Allergies  Lab Results:  Results for orders placed or performed  during the hospital encounter of 01/04/24 (from the past 48 hours)  CBC with Differential/Platelet     Status: None   Collection Time: 01/04/24 10:12 PM  Result Value Ref Range   WBC 9.5 4.0 - 10.5 K/uL   RBC 5.48 4.22 - 5.81 MIL/uL   Hemoglobin 16.4 13.0 - 17.0 g/dL   HCT 69.6 29.5 - 28.4 %   MCV 84.7 80.0 - 100.0 fL   MCH 29.9 26.0 - 34.0 pg   MCHC 35.3 30.0 - 36.0 g/dL   RDW 13.2 44.0 - 10.2 %   Platelets 362 150 - 400 K/uL   nRBC 0.0 0.0 - 0.2 %   Neutrophils Relative % 65 %   Neutro Abs 6.1 1.7 - 7.7 K/uL   Lymphocytes Relative 24 %   Lymphs Abs 2.3 0.7 - 4.0 K/uL   Monocytes Relative 9 %   Monocytes Absolute 0.8 0.1 - 1.0 K/uL   Eosinophils Relative  2 %   Eosinophils Absolute 0.2 0.0 - 0.5 K/uL   Basophils Relative 0 %   Basophils Absolute 0.0 0.0 - 0.1 K/uL   Immature Granulocytes 0 %   Abs Immature Granulocytes 0.04 0.00 - 0.07 K/uL    Comment: Performed at [facility], [address] [place], [place] [address]  Comprehensive metabolic panel     Status: Abnormal   Collection Time: 01/04/24 10:12 PM  Result Value Ref Range   Sodium 138 135 - 145 mmol/L   Potassium 3.7 3.5 - 5.1 mmol/L   Chloride 98 98 - 111 mmol/L   CO2 29 22 - 32 mmol/L   Glucose, Bld 77 70 - 99 mg/dL    Comment: Glucose reference range applies only to samples taken after fasting for at least 8 hours.   BUN 7 6 - 20 mg/dL   Creatinine, Ser 6.44 (H) 0.61 - 1.24 mg/dL   Calcium 9.7 8.9 - 03.4 mg/dL   Total Protein 7.7 6.5 - 8.1 g/dL   Albumin 4.4 3.5 - 5.0 g/dL   AST 33 15 - 41 U/L   ALT 31 0 - 44 U/L   Alkaline Phosphatase 79 38 - 126 U/L   Total Bilirubin 0.8 0.0 - 1.2 mg/dL   GFR, Estimated >74 >25 mL/min    Comment: (NOTE) Calculated using the CKD-EPI Creatinine Equation (2021)    Anion gap 11 5 - 15    Comment: Performed at [facility], [address], [place], [place] [address]  Hemoglobin A1c     Status: None   Collection Time: 01/04/24 10:12 PM  Result Value Ref Range   Hgb A1c MFr Bld 5.0 4.8 - 5.6 %    Comment: (NOTE) Pre diabetes:          5.7%-6.4%  Diabetes:              >6.4%  Glycemic control for   <7.0% adults with diabetes    Mean Plasma Glucose 96.8 mg/dL    Comment: Performed at [facility], [address], [place], [place] [address]  Ethanol     Status: None   Collection Time: 01/04/24 10:12 PM  Result Value Ref Range   Alcohol, Ethyl (B) <10 <10 mg/dL    Comment: (NOTE) Lowest detectable limit for serum alcohol is 10 mg/dL.  For medical purposes only. Performed at [facility], [address], [place], [place] [address]   Lipid panel     Status: None   Collection  Time: 01/04/24 10:12 PM   Result Value Ref Range   Cholesterol 132 0 - 200 mg/dL   Triglycerides 42 <725 mg/dL   HDL 49 >36 mg/dL   Total CHOL/HDL Ratio 2.7 RATIO   VLDL 8 0 - 40 mg/dL   LDL Cholesterol 75 0 - 99 mg/dL    Comment:        Total Cholesterol/HDL:CHD Risk Coronary Heart Disease Risk Table                     Men   Women  1/2 Average Risk   3.4   3.3  Average Risk       5.0   4.4  2 X Average Risk   9.6   7.1  3 X Average Risk  23.4   11.0        Use the calculated Patient Ratio above and the CHD Risk Table to determine the patient's CHD Risk.        ATP III CLASSIFICATION (LDL):  <100     mg/dL   Optimal  644-034  mg/dL   Near or Above                    Optimal  130-159  mg/dL   Borderline  742-595  mg/dL   High  >638     mg/dL   Very High Performed at [facility], [address] [place], [place] [address]     Blood Alcohol level:  Lab Results  Component Value Date   ETH <10 01/04/2024    Metabolic Disorder Labs:  Lab Results  Component Value Date   HGBA1C 5.0 01/04/2024   MPG 96.8 01/04/2024   No results found for: "PROLACTIN" Lab Results  Component Value Date   CHOL 132 01/04/2024   TRIG 42 01/04/2024   HDL 49 01/04/2024   CHOLHDL 2.7 01/04/2024   VLDL 8 01/04/2024   LDLCALC 75 01/04/2024    Current Medications: Current Facility-Administered Medications  Medication Dose Route Frequency Provider Last Rate Last Admin   acetaminophen (TYLENOL) tablet 650 mg  650 mg Oral Q6H PRN Ajibola, Ene A, NP       alum & mag hydroxide-simeth (MAALOX/MYLANTA) 200-200-20 MG/5ML suspension 30 mL  30 mL Oral Q4H PRN Ajibola, Ene A, NP       haloperidol (HALDOL) tablet 5 mg  5 mg Oral TID PRN Ajibola, Ene A, NP       And   diphenhydrAMINE (BENADRYL) capsule 50 mg  50 mg Oral TID PRN Ajibola, Ene A, NP       haloperidol lactate (HALDOL) injection 5 mg  5 mg Intramuscular TID PRN Ajibola, Ene A, NP       And   diphenhydrAMINE (BENADRYL) injection 50 mg  50 mg Intramuscular  TID PRN Ajibola, Ene A, NP       And   LORazepam (ATIVAN) injection 2 mg  2 mg Intramuscular TID PRN Ajibola, Ene A, NP       haloperidol lactate (HALDOL) injection 10 mg  10 mg Intramuscular TID PRN Ajibola, Ene A, NP       And   diphenhydrAMINE (BENADRYL) injection 50 mg  50 mg Intramuscular TID PRN Ajibola, Ene A, NP       And   LORazepam (ATIVAN) injection 2 mg  2 mg Intramuscular TID PRN Ajibola, Ene A, NP       hydrOXYzine (ATARAX) tablet 25 mg  25 mg Oral  TID PRN Ajibola, Ene A, NP       [START ON 01/06/2024] influenza vac split trivalent PF (FLULAVAL) injection 0.5 mL  0.5 mL Intramuscular Tomorrow-1000 Sindy Guadeloupe, NP       magnesium hydroxide (MILK OF MAGNESIA) suspension 30 mL  30 mL Oral Daily PRN Ajibola, Ene A, NP       traZODone (DESYREL) tablet 50 mg  50 mg Oral QHS PRN Ajibola, Ene A, NP        PTA Medications: Medications Prior to Admission  Medication Sig Dispense Refill Last Dose/Taking   amphetamine-dextroamphetamine (ADDERALL XR) 25 MG 24 hr capsule Take 25 mg by mouth daily as needed (as needed ADHD).   Past Month   amphetamine-dextroamphetamine (ADDERALL) 5 MG tablet Take 5 mg by mouth daily as needed (as needed for ADHD).   Past Month   hydrocortisone 2.5 % cream Apply 1 Application topically 2 (two) times daily as needed (as needed for skin/eczema).   Past Month   hydrOXYzine (VISTARIL) 25 MG capsule Take 25 mg by mouth at bedtime as needed for anxiety (sleep).   Past Month   MINERAL OIL-HYDROPHIL PETROLAT EX Apply 1 Application topically daily as needed. For skin/eczema   Past Month   montelukast (SINGULAIR) 10 MG tablet Take 10 mg by mouth at bedtime.   Past Week   triamcinolone ointment (KENALOG) 0.1 % Apply 1 Application topically 2 (two) times daily as needed. As needed for skin/ecxzema   Past Month    Musculoskeletal: Strength & Muscle Tone: within normal limits Gait & Station: normal Patient leans: N/A   Physical Findings: AIMS: Facial and Oral  Movements Muscles of Facial Expression: None Lips and Perioral Area: None Jaw: None Tongue: None,Extremity Movements Upper (arms, wrists, hands, fingers): None Lower (legs, knees, ankles, toes): None, Trunk Movements Neck, shoulders, hips: None, Global Judgements Severity of abnormal movements overall : None Incapacitation due to abnormal movements: None Patient's awareness of abnormal movements: No Awareness, Dental Status Current problems with teeth and/or dentures?: No Does patient usually wear dentures?: No Edentia?: No  CIWA:    COWS:     Psychiatric Specialty Exam:  General Appearance: Dressed casually, slightly unkempt and disheveled  Behavior: Guarded  Psychomotor Activity:No psychomotor agitation but mild retardation noted  Eye Contact: Limited Speech: Decreased amount, decreased tone and volume   Mood: Moderately dysphoric Affect: Restricted to flat sad affect  Thought Process: Linear and goal directed yet concrete Descriptions of Associations: Intact yet concrete Thought Content: Hallucinations: Denies AH, VH, does not appear responding to stimuli Delusions: Reports paranoia prior to admission but denies other delusions, presents guarded with some suspiciousness noted Suicidal Thoughts: Admits to passive SI prior to admission but denies any current passive or active SI, intention, plan  Homicidal Thoughts: Denies HI, intention, plan   Alertness/Orientation: Alert and oriented  Insight: poor Judgment: poor  Memory: Limited  Executive Functions  Concentration: Limited Attention Span: Limited Recall: Limited Fund of Knowledge: Limited   Physical Exam:  Physical Exam Vitals and nursing note reviewed.  Constitutional:      Appearance: Normal appearance. He is normal weight.  HENT:     Head: Normocephalic and atraumatic.     Nose: Nose normal.  Eyes:     Extraocular Movements: Extraocular movements intact.  Pulmonary:     Effort: Pulmonary effort  is normal.  Musculoskeletal:        General: Normal range of motion.     Cervical back: Normal range of motion.  Neurological:  General: No focal deficit present.     Mental Status: He is alert and oriented to person, place, and time. Mental status is at baseline.    Review of Systems  All other systems reviewed and are negative.  Blood pressure 123/89, pulse (!) 58, temperature 98.1 F (36.7 C), temperature source Oral, resp. rate 18, height 5\' 10"  (1.778 m), weight 79.7 kg, SpO2 100%. Body mass index is 25.22 kg/m.   Assets  Assets:Desire for Improvement; Housing; Social Support; Physical Health    Treatment Plan Summary: Daily contact with patient to assess and evaluate symptoms and progress in treatment and Medication management  ASSESSMENT:  Principal Diagnosis: MDD (major depressive disorder), recurrent, severe, with psychosis (HCC) Diagnosis:  Principal Problem:   MDD (major depressive disorder), recurrent, severe, with psychosis (HCC) Active Problems:   Anxiety disorder, unspecified   PLAN: Safety and Monitoring:  -- Voluntary admission to inpatient psychiatric unit for safety, stabilization and treatment  -- Daily contact with patient to assess and evaluate symptoms and progress in treatment  -- Patient's case to be discussed in multi-disciplinary team meeting  -- Observation Level : q15 minute checks  -- Vital signs:  q12 hours  -- Precautions: suicide, elopement, and assault  2. Medications:   Start a trial of Prozac 10 mg daily for depression, will titrate on 4/6 to 20 mg daily to address depression and long-term treatment of anxiety  Start trial of Abilify 5 mg daily to augment antidepressant effect and help with reported psychosis and paranoia  Vistaril 25 mg 3 times daily as needed for anxiety  Trazodone 50 mg at bedtime as needed for sleep  Will monitor and consider if we need to restart BuSpar for anxiety  Discussed with patient not restarting  Adderall for ADHD during his hospital stay. --  The risks/benefits/side-effects/alternatives to this medication were discussed in detail with the patient and time was given for questions. The patient consents to medication trial.    -- Metabolic profile and EKG monitoring obtained while on an atypical antipsychotic (BMI: Lipid Panel: HbgA1c: QTc:)      3. Labs Reviewed: CMP no significant abnormalities noted, CBC no significant abnormalities noted, fasting lipid panel within normal level, hemoglobin A1c 5.0, alcohol level less than 10, tox screen negative      Lab ordered: TSH  4. Group and Therapy: -- Encouraged patient to participate in unit milieu and in scheduled group therapies  5. Discharge Planning:   -- Social work and case management to assist with discharge planning and identification of hospital follow-up needs prior to discharge  -- Estimated LOS: 5-7 days  -- Discharge Concerns: Need to establish a safety plan; Medication compliance and effectiveness  -- Discharge Goals: Return home with outpatient referrals for mental health follow-up including medication management/psychotherapy   The patient is agreeable with the medication plan, as above. We will monitor the patient's response to pharmacologic treatment, and adjust medications as necessary. Patient is encouraged to participate in group therapy while admitted to the psychiatric unit. We will address other chronic and acute stressors, which contributed to the patient's worsening depression and anxiety, passive SI and paranoia, in order to reduce the risk of self-harm at discharge.   Physician Treatment Plan for Primary Diagnosis: MDD (major depressive disorder), recurrent, severe, with psychosis (HCC) Long Term Goal(s): Improvement in symptoms so as ready for discharge  Short Term Goals: Ability to identify changes in lifestyle to reduce recurrence of condition will improve, Ability to verbalize feelings will improve,  Ability to disclose and discuss suicidal ideas, and Ability to demonstrate self-control will improve   I certify that inpatient services furnished can reasonably be expected to improve the patient's condition.    Total Time Spent in Direct Patient Care:  I personally spent 55 minutes on the unit in direct patient care. The direct patient care time included face-to-face time with the patient, reviewing the patient's chart, communicating with other professionals, and coordinating care. Greater than 50% of this time was spent in counseling or coordinating care with the patient regarding goals of hospitalization, psycho-education, and discharge planning needs.   Kayia Billinger Abbott Pao, MD 4/5/202511:05 AM
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
## 2024-01-06 DIAGNOSIS — F333 Major depressive disorder, recurrent, severe with psychotic symptoms: Secondary | ICD-10-CM | POA: Diagnosis not present

## 2024-01-06 LAB — TSH: TSH: 0.916 u[IU]/mL (ref 0.350–4.500)

## 2024-01-06 LAB — PROLACTIN: Prolactin: 13.9 ng/mL (ref 3.6–31.5)

## 2024-01-06 NOTE — Plan of Care (Signed)
 Pt presents with flat expression and depressed affect. Denies SI, HI, AVH, and pain. Cooperative and calm in interactions with staff. Pt observed in the dayroom engaging in group and with peers. Medication compliant with no adverse reactions. Safety checks maintained at q 15 minutes. Support, encouragement, and reassurance offered to the pt.   Problem: Education: Goal: Emotional status will improve Outcome: Progressing Goal: Mental status will improve Outcome: Progressing Goal: Verbalization of understanding the information provided will improve Outcome: Progressing   Problem: Activity: Goal: Interest or engagement in activities will improve Outcome: Progressing   Problem: Safety: Goal: Periods of time without injury will increase Outcome: Progressing

## 2024-01-06 NOTE — Progress Notes (Signed)
 D: Patient is alert, oriented, pleasant, and cooperative. Denies SI, HI, AVH, and verbally contracts for safety. Patient reports he slept fair last night with sleeping medication. Patient reports his appetite as good, energy level as normal, and concentration as good. Patient rates his depression 0/10, hopelessness 0/10, and anxiety 0/10. Patient reports hip pain.    A: Scheduled medications administered per MD order. PRN nicotine gum and tylenol administered. Support provided. Patient educated on safety on the unit and medications. Routine safety checks every 15 minutes. Patient stated understanding to tell nurse about any new physical symptoms. Patient understands to tell staff of any needs.     R: No adverse drug reactions noted. Patient remains safe at this time and will continue to monitor.    01/06/24 1000  Psych Admission Type (Psych Patients Only)  Admission Status Voluntary/72 hour document signed  Date 72 hour document signed  01/05/24  Time 72 hour document signed  1351  Psychosocial Assessment  Patient Complaints None  Eye Contact Fair  Facial Expression Flat  Affect Depressed  Speech Logical/coherent  Interaction Assertive  Motor Activity Other (Comment) (WNL)  Appearance/Hygiene Unremarkable  Behavior Characteristics Cooperative;Calm  Mood Depressed  Thought Process  Coherency WDL  Content WDL  Delusions None reported or observed  Perception WDL  Hallucination None reported or observed  Judgment Limited  Confusion None  Danger to Self  Current suicidal ideation? Denies  Self-Injurious Behavior No self-injurious ideation or behavior indicators observed or expressed   Agreement Not to Harm Self Yes  Description of Agreement verbal  Danger to Others  Danger to Others None reported or observed

## 2024-01-06 NOTE — Plan of Care (Signed)

## 2024-01-06 NOTE — Group Note (Signed)
 Date:  01/06/2024 Time:  11:21 PM  Group Topic/Focus:  Wrap-Up Group:   The focus of this group is to help patients review their daily goal of treatment and discuss progress on daily workbooks.    Participation Level:  Active  Participation Quality:  Appropriate, Attentive, Sharing, and Supportive  Affect:  Appropriate  Cognitive:  Alert and Appropriate  Insight: Appropriate  Engagement in Group:  Engaged  Modes of Intervention:  Discussion  Additional Comments:  pt was attentive and appropriate during tonight's wrap up group. Pt was able share that today was a good day. Enjoyed outside with peers. Was able to talk with peers and smile a lot more today.   Antonio Dunn D 01/06/2024, 11:21 PM

## 2024-01-06 NOTE — Group Note (Signed)
 Date:  01/06/2024 Time:  9:18 AM  Group Topic/Focus:  Goals Group:   The focus of this group is to help patients establish daily goals to achieve during treatment and discuss how the patient can incorporate goal setting into their daily lives to aide in recovery. Orientation:   The focus of this group is to educate the patient on the purpose and policies of crisis stabilization and provide a format to answer questions about their admission.  The group details unit policies and expectations of patients while admitted.    Participation Level:  Did Not Attend  Participation Quality:  n/a  Affect:  n/a  Cognitive:  n/a  Insight: None  Engagement in Group:  n/a  Modes of Intervention:  n/a  Additional Comments:  n/a  Dimas Chyle Patte Winkel 01/06/2024, 9:18 AM

## 2024-01-06 NOTE — Group Note (Signed)
 Date:  01/06/2024 Time:  9:55 AM  Group Topic/Focus:  The topic of this group is mindful breathing    Participation Level:  Did Not Attend  P
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
## 2024-01-07 ENCOUNTER — Encounter (HOSPITAL_COMMUNITY): Payer: Self-pay

## 2024-01-07 DIAGNOSIS — F333 Major depressive disorder, recurrent, severe with psychotic symptoms: Secondary | ICD-10-CM

## 2024-01-07 MED ORDER — TRAZODONE HCL 50 MG PO TABS: 50.0000 mg | ORAL_TABLET | Freq: Every evening | ORAL | 0 refills | Status: AC | PRN

## 2024-01-07 MED ORDER — FLUOXETINE HCL 20 MG PO CAPS: 20.0000 mg | ORAL_CAPSULE | Freq: Every day | ORAL | 0 refills | Status: AC

## 2024-01-07 MED ORDER — HYDROXYZINE HCL 25 MG PO TABS: 25.0000 mg | ORAL_TABLET | Freq: Three times a day (TID) | ORAL | 0 refills | Status: AC | PRN

## 2024-01-07 MED ORDER — FLUOXETINE HCL 20 MG PO CAPS
20.0000 mg | ORAL_CAPSULE | Freq: Every day | ORAL | Status: DC
  Administered 2024-01-07 – 2024-01-08 (×2): 20 mg via ORAL
  Filled 2024-01-07 (×5): qty 1

## 2024-01-07 MED ORDER — NICOTINE POLACRILEX 2 MG MT GUM: 2.0000 mg | CHEWING_GUM | OROMUCOSAL | 0 refills | Status: AC | PRN

## 2024-01-07 MED ORDER — ARIPIPRAZOLE 5 MG PO TABS: 5.0000 mg | ORAL_TABLET | Freq: Every day | ORAL | 0 refills | Status: AC

## 2024-01-07 NOTE — Group Note (Signed)
 Date:  01/07/2024 Time:  9:21 AM  Group Topic/Focus:  Goals Group:   The focus of this group is to help patients establish daily goals to achieve during treatment and discuss how the patient can incorporate goal setting into their daily lives to aide in recovery.    Participation Level:  Active  Participation Quality:  Appropriate  Affect:  Appropriate   [name] Score 01/07/2024, 9:21 AM

## 2024-01-07 NOTE — BH IP Treatment Plan (Signed)
 Interdisciplinary Treatment and Diagnostic Plan Update  01/07/2024 Time of Session: 1041 Antonio Dunn MRN: [id number]  Principal Diagnosis: MDD (major depressive disorder), recurrent, severe, with psychosis (HCC)  Secondary Diagnoses: Principal Problem:   MDD (major depressive disorder), recurrent, severe, with psychosis (HCC) Active Problems:   Anxiety disorder, unspecified   Current Medications:  Current Facility-Administered Medications  Medication Dose Route Frequency Provider Last Rate Last Admin   acetaminophen (TYLENOL) tablet 650 mg  650 mg Oral Q6H PRN Ajibola, Ene A, NP   650 mg at 01/07/24 0809   alum & mag hydroxide-simeth (MAALOX/MYLANTA) 200-200-20 MG/5ML suspension 30 mL  30 mL Oral Q4H PRN Ajibola, Ene A, NP       ARIPiprazole (ABILIFY) tablet 5 mg  5 mg Oral Daily Attiah, Nadir, MD   5 mg at 01/07/24 4098   haloperidol (HALDOL) tablet 5 mg  5 mg Oral TID PRN Ajibola, Ene A, NP       And   diphenhydrAMINE (BENADRYL) capsule 50 mg  50 mg Oral TID PRN Ajibola, Ene A, NP       haloperidol lactate (HALDOL) injection 5 mg  5 mg Intramuscular TID PRN Ajibola, Ene A, NP       And   diphenhydrAMINE (BENADRYL) injection 50 mg  50 mg Intramuscular TID PRN Ajibola, Ene A, NP       And   LORazepam (ATIVAN) injection 2 mg  2 mg Intramuscular TID PRN Ajibola, Ene A, NP       haloperidol lactate (HALDOL) injection 10 mg  10 mg Intramuscular TID PRN Ajibola, Ene A, NP       And   diphenhydrAMINE (BENADRYL) injection 50 mg  50 mg Intramuscular TID PRN Ajibola, Ene A, NP       And   LORazepam (ATIVAN) injection 2 mg  2 mg Intramuscular TID PRN Ajibola, Ene A, NP       FLUoxetine (PROZAC) capsule 20 mg  20 mg Oral Daily Attiah, Nadir, MD   20 mg at 01/07/24 1001   hydrOXYzine (ATARAX) tablet 25 mg  25 mg Oral TID PRN Ajibola, Ene A, NP       influenza vac split trivalent PF (FLULAVAL) injection 0.5 mL  0.5 mL Intramuscular Tomorrow-1000 Sindy Guadeloupe, NP       magnesium hydroxide (MILK  OF MAGNESIA) suspension 30 mL  30 mL Oral Daily PRN Ajibola, Ene A, NP       nicotine polacrilex (NICORETTE) gum 2 mg  2 mg Oral PRN Abbott Pao, Nadir, MD   2 mg at 01/07/24 0808   traZODone (DESYREL) tablet 50 mg  50 mg Oral QHS PRN Ajibola, Ene A, NP   50 mg at 01/06/24 2100   PTA Medications: Medications Prior to Admission  Medication Sig Dispense Refill Last Dose/Taking   amphetamine-dextroamphetamine (ADDERALL XR) 25 MG 24 hr capsule Take 25 mg by mouth daily as needed (as needed ADHD).   Past Month   amphetamine-dextroamphetamine (ADDERALL) 5 MG tablet Take 5 mg by mouth daily as needed (as needed for ADHD).   Past Month   hydrocortisone 2.5 % cream Apply 1 Application topically 2 (two) times daily as needed (as needed for skin/eczema).   Past Month   hydrOXYzine (VISTARIL) 25 MG capsule Take 25 mg by mouth at bedtime as needed for anxiety (sleep).   Past Month   MINERAL OIL-HYDROPHIL PETROLAT EX Apply 1 Application topically daily as needed. For skin/eczema   Past Month   montelukast (SINGULAIR) 10 MG tablet  Take 10 mg by mouth at bedtime.   Past Week   triamcinolone ointment (KENALOG) 0.1 % Apply 1 Application topically 2 (two) times daily as needed. As needed for skin/ecxzema   Past Month    Patient Stressors: Financial difficulties   Marital or family conflict    Patient Strengths: Average or above average intelligence  General fund of knowledge  Supportive family/friends   Treatment Modalities: Medication Management, Group therapy, Case management,  1 to 1 session with clinician, Psychoeducation, Recreational therapy.   Physician Treatment Plan for Primary Diagnosis: MDD (major depressive disorder), recurrent, severe, with psychosis (HCC) Long Term Goal(s): Improvement in symptoms so as ready for discharge   Short Term Goals: Ability to identify changes in lifestyle to reduce recurrence of condition will improve Ability to verbalize feelings will improve Ability to disclose and  discuss suicidal ideas Ability to demonstrate self-control will improve  Medication Management: Evaluate patient's response, side effects, and tolerance of medication regimen.  Therapeutic Interventions: 1 to 1 sessions, Unit Group sessions and Medication administration.  Evaluation of Outcomes: Not Progressing  Physician Treatment Plan for Secondary Diagnosis: Principal Problem:   MDD (major depressive disorder), recurrent, severe, with psychosis (HCC) Active Problems:   Anxiety disorder, unspecified  Long Term Goal(s): Improvement in symptoms so as ready for discharge   Short Term Goals: Ability to identify changes in lifestyle to reduce recurrence of condition will improve Ability to verbalize feelings will improve Ability to disclose and discuss suicidal ideas Ability to demonstrate self-control will improve     Medication Management: Evaluate patient's response, side effects, and tolerance of medication regimen.  Therapeutic Interventions: 1 to 1 sessions, Unit Group sessions and Medication administration.  Evaluation of Outcomes: Progressing   RN Treatment Plan for Primary Diagnosis: MDD (major depressive disorder), recurrent, severe, with psychosis (HCC) Long Term Goal(s): Knowledge of disease and therapeutic regimen to maintain health will improve  Short Term Goals: Ability to remain free from injury will improve, Ability to demonstrate self-control, Ability to identify and develop effective coping behaviors will improve, and Compliance with prescribed medications will improve  Medication Management: RN will administer medications as ordered by provider, will assess and evaluate patient's response and provide education to patient for prescribed medication. RN will report any adverse and/or side effects to prescribing provider.  Therapeutic Interventions: 1 on 1 counseling sessions, Psychoeducation, Medication administration, Evaluate responses to treatment, Monitor vital  signs and CBGs as ordered, Perform/monitor CIWA, COWS, AIMS and Fall Risk screenings as ordered, Perform wound care treatments as ordered.  Evaluation of Outcomes: Progressing   LCSW Treatment Plan for Primary Diagnosis: MDD (major depressive disorder), recurrent, severe, with psychosis (HCC) Long Term Goal(s): Safe transition to appropriate next level of care at discharge, Engage patient in therapeutic group addressing interpersonal concerns.  Short Term Goals: Engage patient in aftercare planning with referrals and resources, Increase social support, Increase ability to appropriately verbalize feelings, and Facilitate acceptance of mental health diagnosis and concerns  Therapeutic Interventions: Assess for all discharge needs, 1 to 1 time with Social worker, Explore available resources and support systems, Assess for adequacy in community support network, Educate family and significant other(s) on suicide prevention, Complete Psychosocial Assessment, Interpersonal group therapy.  Evaluation of Outcomes: Progressing   Progress in Treatment: Attending groups: Yes. Participating in groups: Yes. Taking medication as prescribed: Yes. Toleration medication: Yes. Family/Significant other contact made: Yes, individual(s) contacted:  Father, Sedale Jenifer  Patient understands diagnosis: Yes. Discussing patient identified problems/goals with staff: Yes. Medical problems  stabilized or resolved: Yes. Denies suicidal/homicidal ideation: Yes. Issues/concerns per patient self-inventory: No. Other: N/a  New problem(s) identified: No, Describe:  None  New Short Term/Long Term Goal(s): medication stabilization, elimination of SI thoughts, development of comprehensive mental wellness plan.   Patient Goals: "Taking meds"  Discharge Plan or Barriers: Patient recently admitted. CSW will continue to follow and assess for appropriate referrals and possible discharge planning.   Reason for Continuation  of Hospitalization: Medication stabilization Other; describe Mood stabilization, discharge planning  Estimated Length of Stay: 1-3 DAYS  Last 3 [place] Suicide Severity Risk Score: Flowsheet Row [facility] (Current[facility] from 01/04/2024 in [facility]  C-SSRS RISK CATEGORY Low Risk Low Risk       Last [facility] 2/9 Scores:     No data to display          Scribe for Treatment Team: Jacinta Shoe, LCSW 01/07/2024 6:11 PM

## 2024-01-07 NOTE — Progress Notes (Signed)
 [facility] MD Progress Note  01/07/2024 9:29 AM Antonio Dunn  MRN:  [id number]   Reason for Admission:  Antonio Dunn is Dunn 23 y.o., male with Dunn past psychiatric history significant for depression and anxiety who presents to the [facility] from behavioral health urgent care for evaluation and management of worsening depression, irritability and paranoia.  According to outside records, the patient presented to behavioral health urgent care with his father voluntarily with report of increased paranoia, SI with no plan, worsening depression and agitation.The patient is currently on Hospital Day 2.   Chart Review from last 24 hours:  The patient's chart was reviewed and nursing notes were reviewed. The patient's case was discussed in multidisciplinary team meeting. Per [facility] patient is compliant with medications on the unit, as needed trazodone for sleep used almost nightly, no as needed medication needed were given for anxiety or agitation.  Patient signed 72 hours request for discharge on 4/5 around 1:50 PM.  Information Obtained Today During Patient Interview: The patient was seen and evaluated on the unit. On assessment today the patient presents much more fluent and interactive, no disorganized thought process noted presents linear, denies paranoia or other delusions, reports good sleep and appetite, denies depressed mood or anxiety he presents slightly depressed if any but calm and interactive.  He denies passive or active SI intention or plan, denies HI or AVH.  He does not appear responding to stimuli, he presents future and goal oriented talking about staying with his father in [place] for Dunn week before getting back to [place] where he participates in work and activities in addition to his classes.  He denies side effect of medication and agrees to comply with medications after discharge as well as outpatient follow-up appointments for medication management and counseling which he  wishes to have follow-up appointment to be in person in [place] area.  I spoke with patient's father who visited with patient yesterday and reports that patient is significantly improved since admission and agrees with discharge plan tomorrow morning, discussed with patient's father follow-up plan to be scheduled in [place] as far as his follow-up appointments.    Given patient's presentation patient does not meet criteria for IVC, he was admitted voluntarily, currently denies SI HI or AVH, signed 72 hours release request which will expire on 4/8 at 1:50 PM  Sleep  Fair  Principal Problem: MDD (major depressive disorder), recurrent, severe, with psychosis (HCC) Diagnosis: Principal Problem:   MDD (major depressive disorder), recurrent, severe, with psychosis (HCC) Active Problems:   Anxiety disorder, unspecified    Past Psychiatric History: Prior Psychiatric diagnoses: Depression, anxiety and ADHD Past Psychiatric Hospitalizations: Denies   History of self mutilation: Denies Past suicide attempts: Denies Past history of HI, violent or aggressive behavior: Denies   Past Psychiatric medications trials: Lexapro, BuSpar, Vistaril and Adderall History of ECT/TMS: Denies   Outpatient psychiatric Follow up: Seeing Dunn psychiatrist in [place] area for the past year with last visit in January Prior Outpatient Therapy: Was seeing Dunn counselor at the same office with the psychiatric provider but noncompliant with visits per his report  Past Medical History:  Past Medical History:  Diagnosis Date   Medical history non-contributory    History reviewed. No pertinent surgical history. Family History: History reviewed. No pertinent family history. 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 Copy service: Denies Legal history: Denies pending charges or court dates Trauma: Denies Access to guns: Denies   Substance Use History: Alcohol: Reports drinking alcohol rarely about once Dunn year but denies any symptoms consistent with blackout or withdrawals Tobacoo: Non-smoker Marijuana: Denies Cocaine: Denies Stimulants: Denies IV drug use: Denies Opiates: Denies Prescribed Meds abuse: Denies H/O withdrawals, blackouts, DTs: Denies History of Detox / Rehab: Denies DUI: Denies  Current Medications: Current Facility-Administered Medications  Medication Dose Route Frequency Provider Last Rate Last Admin   acetaminophen (TYLENOL) tablet 650 mg  650 mg Oral Q6H PRN Ajibola, Antonio A, NP   650 mg at 01/07/24 0809   alum & mag hydroxide-simeth (MAALOX/MYLANTA) 200-200-20 MG/5ML suspension 30 mL  30 mL Oral Q4H PRN Ajibola, Antonio A, NP       ARIPiprazole (ABILIFY) tablet 5 mg  5 mg Oral Daily Antonio Tippett, MD   5 mg at 01/07/24 1478   haloperidol (HALDOL) tablet 5 mg  5 mg Oral TID PRN Ajibola, Antonio A, NP       And   diphenhydrAMINE (BENADRYL) capsule 50 mg  50 mg Oral TID PRN Ajibola, Antonio A, NP       haloperidol lactate (HALDOL) injection 5 mg  5 mg Intramuscular TID PRN Ajibola, Antonio A, NP       And   diphenhydrAMINE (BENADRYL) injection 50 mg  50 mg Intramuscular TID PRN Ajibola, Antonio A, NP       And   LORazepam (ATIVAN) injection 2 mg  2 mg Intramuscular TID PRN Ajibola, Antonio A, NP       haloperidol lactate (HALDOL) injection 10 mg  10 mg Intramuscular TID PRN Ajibola, Antonio A, NP       And   diphenhydrAMINE (BENADRYL) injection 50 mg  50 mg Intramuscular TID PRN Ajibola, Antonio A, NP       And   LORazepam (ATIVAN) injection 2 mg  2 mg Intramuscular TID PRN Ajibola, Antonio A, NP       FLUoxetine  (PROZAC) capsule 20 mg  20 mg Oral Daily Estanislado Surgeon, MD       hydrOXYzine (ATARAX) tablet 25 mg  25 mg Oral TID PRN Ajibola, Antonio A, NP       influenza vac split trivalent PF (FLULAVAL) injection 0.5 mL  0.5 mL Intramuscular Tomorrow-1000 Sindy Guadeloupe, NP       magnesium hydroxide (MILK OF MAGNESIA) suspension 30 mL  30 mL Oral Daily PRN Ajibola, Antonio A, NP       nicotine polacrilex (NICORETTE) gum 2 mg  2 mg Oral PRN Abbott Pao, Yalonda Sample, MD   2 mg at 01/07/24 0808   traZODone (DESYREL) tablet 50 mg  50 mg Oral QHS PRN Ajibola, Antonio A, NP   50 mg at 01/06/24 2100    Lab Results:  Results for orders placed or performed during the hospital encounter of 01/05/24 (from the past 48 hours)  TSH     Status: None   Collection Time: 01/06/24  6:22 AM  Result Value Ref Range   TSH 0.916 0.350 - 4.500 uIU/mL    Comment: Performed by Dunn 3rd Generation assay with Dunn functional sensitivity of <=0.01 uIU/mL. Performed at [facility], [address] [place], [place] [address]  Blood Alcohol level:  Lab Results  Component Value Date   ETH <10 01/04/2024    Metabolic Disorder Labs: Lab Results  Component Value Date   HGBA1C 5.0 01/04/2024   MPG 96.8 01/04/2024   Lab Results  Component Value Date   PROLACTIN 13.9 01/04/2024   Lab Results  Component Value Date   CHOL 132 01/04/2024   TRIG 42 01/04/2024   HDL 49 01/04/2024   CHOLHDL 2.7 01/04/2024   VLDL 8 01/04/2024   LDLCALC 75 01/04/2024    Physical Findings: AIMS: Facial and Oral Movements Muscles of Facial Expression: None Lips and Perioral Area: None Jaw: None Tongue: None,Extremity Movements Upper (arms, wrists, hands, fingers): None Lower (legs, knees, ankles, toes): None, Trunk Movements Neck, shoulders, hips: None, Global Judgements Severity of abnormal movements overall : None Incapacitation due to abnormal movements: None Patient's awareness of abnormal movements: No Awareness, Dental Status Current  problems with teeth and/or dentures?: No Does patient usually wear dentures?: No Edentia?: No  CIWA:    COWS:     Musculoskeletal: Strength & Muscle Tone: within normal limits Gait & Station: normal Patient leans: N/Dunn  Psychiatric Specialty Exam:  General Appearance: Dressed casually, fairly groomed, appears at stated age   Behavior: Much less guarded and more interactive, cooperative and pleasant   Psychomotor Activity:No psychomotor agitation but mild retardation noted   Eye Contact: Improved Speech: More fluent speech, normal amount, decreased tone and volume     Mood: Seems to be less dysphoric and more animated today Affect: Restricted affect but brightening easily   Thought Process: Linear and goal directed yet concrete Descriptions of Associations: Intact yet concrete Thought Content: Hallucinations: Denies AH, VH, does not appear responding to stimuli Delusions: No paranoia or other delusions noted Suicidal Thoughts: Denies passive or active SI, intention, plan  Homicidal Thoughts: Denies HI, intention, plan    Alertness/Orientation: Alert and oriented   Insight: Limited Judgment: Limited   Memory: Fair   Art therapist  Concentration: Fair Attention Span: Fair Recall: [employer] of Knowledge: Fair  Assets  Assets: Desire for Improvement; Housing; Social Support; Physical Health    Physical Exam: Physical Exam Vitals and nursing note reviewed.    Review of Systems  All other systems reviewed and are negative.  Blood pressure 138/86, pulse 64, temperature 98 F (36.7 C), temperature source Oral, resp. rate 16, height 5\' 10"  (1.778 m), weight 79.7 kg, SpO2 100%. Body mass index is 25.22 kg/m.   Treatment Plan Summary: Daily contact with patient to assess and evaluate symptoms and progress in treatment and Medication management  ASSESSMENT:  Diagnoses / Active Problems: Principal Problem: MDD (major depressive disorder), recurrent,  severe, with psychosis (HCC) Diagnosis: Principal Problem:   MDD (major depressive disorder), recurrent, severe, with psychosis (HCC) Active Problems:   Anxiety disorder, unspecified   PLAN: Safety and Monitoring:  -- Voluntary/72 hours discharge request was signed 4/5 admission to inpatient psychiatric unit for safety, stabilization and treatment  -- Daily contact with patient to assess and evaluate symptoms and progress in treatment  -- Patient's case to be discussed in multi-disciplinary team meeting  -- Observation Level : q15 minute checks  -- Vital signs:  q12 hours  -- Precautions: suicide, elopement, and assault  2. Medications:              Continue Prozac 20 mg daily to address depression and long-term treatment of anxiety             Continue Abilify  5 mg daily to augment antidepressant effect and help with reported psychosis and paranoia             Continue Vistaril 25 mg 3 times daily as needed for anxiety             continue trazodone 50 mg at bedtime as needed for sleep             Will monitor and consider if we need to restart BuSpar for anxiety             Discussed with patient not restarting Adderall for ADHD during his hospital stay. --  The risks/benefits/side-effects/alternatives to this medication were discussed in detail with the patient and time was given for questions. The patient consents to medication trial.                -- Metabolic profile and EKG monitoring obtained while on an atypical antipsychotic (BMI: Lipid Panel: HbgA1c: QTc:)                            3. Labs Reviewed: CMP no significant abnormalities noted, CBC no significant abnormalities noted, fasting lipid panel within normal level, hemoglobin A1c 5.0, alcohol level less than 10, tox screen negative TSH within normal level      Lab ordered: None   4. Group and Therapy: -- Encouraged patient to participate in unit milieu and in scheduled group therapies   5. Discharge Planning:               -- Social work and case management to assist with discharge planning and identification of hospital follow-up needs prior to discharge             -- Estimated LOS: 5-7 days             -- Discharge Concerns: Need to establish Dunn safety plan; Medication compliance and effectiveness             -- Discharge Goals: Return home with outpatient referrals for mental health follow-up including medication management/psychotherapy     Total Time Spent in Direct Patient Care:  I personally spent 35 minutes on the unit in direct patient care. The direct patient care time included face-to-face time with the patient, reviewing the patient's chart, communicating with other professionals, and coordinating care. Greater than 50% of this time was spent in counseling or coordinating care with the patient regarding goals of hospitalization, psycho-education, and discharge planning needs.   Nazair Fortenberry Abbott Pao, MD 01/07/2024, 9:29 AM

## 2024-01-07 NOTE — [facility] Group Notes (Signed)
 [facility] Group Notes:  (Nursing/MHT/Case Management/Adjunct)  Date:  01/07/2024  Time:  9:37 PM  Type of Therapy:  Psychoeducational Skills  Participation Level:  Minimal  Participation Quality:  Attentive  Affect:  Appropriate  Cognitive:  Lacking  Insight:  Lacking  Engagement in Group:  None  Modes of Intervention:  Education  Summary of Progress/Problems: The patient attended the evening A.A.speaker's meeting and was appropriate.   Westly Pam 01/07/2024, 9:37 PM

## 2024-01-07 NOTE — B[facility] Group Notes (Signed)
 Spiritual care group on grief and loss facilitated by Chaplain Dyanne Carrel, [facility]  Group Goal: Support / Education around grief and loss  Members engage in facilitated group support and psycho-social education.  Group Description:  Following introductions and group rules, group members engaged in facilitated group dialogue and support around topic of loss, with particular support around experiences of loss in their lives. Group Identified types of loss (relationships / self / things) and identified patterns, circumstances, and changes that precipitate losses. Reflected on thoughts / feelings around loss, normalized grief responses, and recognized variety in grief experience. Group encouraged individual reflection on safe space and on the coping skills that they are already utilizing.  Group drew on Adlerian / Rogerian and narrative framework  Patient Progress: Antonio Dunn attended group and actively engaged and participated in group conversation and activities.  Comments demonstrated good insight and contributed positively to the group conversation.

## 2024-01-07 NOTE — B[facility] Suicide Risk Assessment (Signed)
 [facility] INPATIENT:  Family/Significant Other Suicide Prevention Education  Suicide Prevention Education:  Education Completed; Antonio Dunn - father [phone]),  has been identified by the patient as the family member/significant other with whom the patient will be residing, and identified as the person(s) who will aid the patient in the event of a mental health crisis (suicidal ideations/suicide attempt).  With written consent from the patient, the family member/significant other has been provided the following suicide prevention education, prior to the and/or following the discharge of the patient.  The suicide prevention education provided includes the following: Suicide risk factors Suicide prevention and interventions National Suicide Hotline telephone number [facility] assessment telephone number [facility] Emergency Assistance 911 [facility] and/or Residential Mobile Crisis Unit telephone number  Request made of family/significant other to: Remove weapons (e.g., guns, rifles, knives), all items previously/currently identified as safety concern.   Remove drugs/medications (over-the-counter, prescriptions, illicit drugs), all items previously/currently identified as a safety concern.  The family member/significant other verbalizes understanding of the suicide prevention education information provided.  The family member/significant other agrees to remove the items of safety concern listed above.  Antonio Oms Caedyn Raygoza, LCSW 01/07/2024, 7:05 PM

## 2024-01-07 NOTE — Plan of Care (Signed)
   Problem: Education: Goal: Emotional status will improve Outcome: Progressing Goal: Mental status will improve Outcome: Progressing   Problem: Activity: Goal: Interest or engagement in activities will improve Outcome: Progressing Goal: Sleeping patterns will improve Outcome: Progressing

## 2024-01-07 NOTE — Progress Notes (Signed)
   01/07/24 0800  Psych Admission Type (Psych Patients Only)  Admission Status Voluntary/72 hour document signed  Psychosocial Assessment  Patient Complaints None  Eye Contact Fair  Facial Expression Flat  Affect Flat;Appropriate to circumstance  Speech Logical/coherent  Interaction Assertive  Motor Activity Other (Comment) (WNL)  Appearance/Hygiene Unremarkable  Behavior Characteristics Appropriate to situation  Mood Sad  Thought Process  Coherency WDL  Content WDL  Delusions None reported or observed  Perception WDL  Hallucination None reported or observed  Judgment Limited  Confusion None  Danger to Self  Current suicidal ideation? Denies  Description of Suicide Plan nom plan  Self-Injurious Behavior No self-injurious ideation or behavior indicators observed or expressed   Agreement Not to Harm Self Yes  Description of Agreement verbal  Danger to Others  Danger to Others None reported or observed

## 2024-01-07 NOTE — Progress Notes (Signed)
 Chaplain attempted to meet with Antonio Dunn to assist with [facility] information, but he was asleep at time of visit.  Will attempt again tomorrow.

## 2024-01-07 NOTE — Progress Notes (Signed)
   01/06/24 2029  Psych Admission Type (Psych Patients Only)  Admission Status Voluntary/72 hour document signed  Psychosocial Assessment  Patient Complaints None  Eye Contact Fair  Facial Expression Flat  Affect Depressed  Speech Logical/coherent  Interaction Assertive  Motor Activity Other (Comment) (q15 minute checks)  Appearance/Hygiene Unremarkable  Behavior Characteristics Cooperative;Calm  Mood Depressed  Thought Process  Coherency WDL  Content WDL  Delusions None reported or observed  Perception WDL  Judgment Limited  Confusion None  Danger to Self  Current suicidal ideation? Denies  Agreement Not to Harm Self Yes  Description of Agreement verbal  Danger to Others  Danger to Others None reported or observed

## 2024-01-07 NOTE — Progress Notes (Signed)
 Patient denies SI/HI/AVH this morning. Pt rates his depression a 0/10 and anxiety a 0/10. Pt reports that he slept "good" last night. Pt complains of 4/10 left hip pain that is chronic, PRN tylenol administered per MAR. Pt has been interactive on the unit and participating in groups throughout the day. Pt has been calm and cooperative throughout the day. Patient has been compliant with medications and treatment plan. Q 15 minute safety checks are in place for patient's safety. Patient is currently safe on the unit.

## 2024-01-08 DIAGNOSIS — F333 Major depressive disorder, recurrent, severe with psychotic symptoms: Secondary | ICD-10-CM | POA: Diagnosis not present

## 2024-01-08 NOTE — Plan of Care (Signed)
 ?  Problem: Education: ?Goal: Mental status will improve ?Outcome: Progressing ?Goal: Verbalization of understanding the information provided will improve ?Outcome: Progressing ?  ?

## 2024-01-08 NOTE — Group Note (Unsigned)
 Date:  01/08/2024 Time:  10:53 AM  Group Topic/Focus:  Goals Group:   The focus of this group is to help patients establish daily goals to achieve during treatment and discuss how the patient can incorporate goal setting into their daily lives to aide in recovery.     Participation Level:  {BHH PARTICIPATION LKGMW:10272}  Participation Quality:  {BHH PARTICIPATION QUALITY:22265}  Affect:  {BHH AFFECT:22266}  Cognitive:  {BHH COGNITIVE:22267}  Insight: {BHH Insight2:20797}  Engagement in Group:  {BHH ENGAGEMENT IN ZDGUY:40347}  Modes of Intervention:  {BHH MODES OF INTERVENTION:22269}  Additional Comments:  ***  Antonio Dunn 01/08/2024, 10:53 AM

## 2024-01-08 NOTE — Progress Notes (Signed)
 Discharge Note: Pt continues to deny SI, HI or AVH.  He was given AVS along with follow up instructions.and called in prescriptions.  as ordered.  Pt was given his belongings back from locker #4 and escorted to the lobby. Pt left with his father with no distress noted.

## 2024-01-08 NOTE — Progress Notes (Signed)
   01/07/24 2030  Psych Admission Type (Psych Patients Only)  Admission Status Voluntary/72 hour document signed  Psychosocial Assessment  Patient Complaints None  Eye Contact Fair  Facial Expression Flat  Affect Appropriate to circumstance;Flat  Speech Logical/coherent  Interaction Assertive  Motor Activity Other (Comment) (WNL)  Appearance/Hygiene Unremarkable  Behavior Characteristics Appropriate to situation  Mood Sad  Thought Process  Coherency WDL  Content WDL  Delusions None reported or observed  Perception WDL  Hallucination None reported or observed  Judgment Limited  Confusion None  Danger to Self  Current suicidal ideation? Denies  Description of Suicide Plan No Plan  Self-Injurious Behavior No self-injurious ideation or behavior indicators observed or expressed   Agreement Not to Harm Self Yes  Description of Agreement Verbal Contract  Danger to Others  Danger to Others None reported or observed

## 2024-01-08 NOTE — Group Note (Unsigned)
 Date:  01/08/2024 Time:  9:54 AM  Group Topic/Focus:  Goals Group:   The focus of this group is to help patients establish daily goals to achieve during treatment and discuss how the patient can incorporate goal setting into their daily lives to aide in recovery.     Participation Level:  {BHH PARTICIPATION GNFAO:13086}  Participation Quality:  {BHH PARTICIPATION QUALITY:22265}  Affect:  {BHH AFFECT:22266}  Cognitive:  {BHH COGNITIVE:22267}  Insight: {BHH Insight2:20797}  Engagement in Group:  {BHH ENGAGEMENT IN VHQIO:96295}  Modes of Intervention:  {BHH MODES OF INTERVENTION:22269}  Additional Comments:  ***  Estill Dooms 01/08/2024, 9:54 AM

## 2024-01-08 NOTE — Discharge Summary (Signed)
 Physician Discharge Summary Note  Patient:  Antonio Dunn is an 23 y.o., male MRN:  [id number] DOB:  04/05/2001 Patient phone:  [phone] (home)  Patient address:   [address] [place] [address],  Total Time spent with patient: 20 minutes  Date of Admission:  01/05/2024 Date of Discharge: 01/08/24  Reason for Admission:  as per H&P: "23 y.o., male with a past psychiatric history significant for depression and anxiety who presents to the [facility] from behavioral health urgent care for evaluation and management of worsening depression, irritability and paranoia.  According to outside records, the patient presented to behavioral health urgent care with his father voluntarily with report of increased paranoia, SI with no plan, worsening depression and agitation.   Initial assessment on 01/05/24, patient was evaluated on the inpatient unit, the patient reports he was diagnosed with depression and anxiety in 2023 by primary care providers and was sent to see a psychiatrist in [place] for the past year with last visit in January where he has been receiving treatment with medications including Lexapro BuSpar Vistaril and Adderall for depression, anxiety and ADHD, he reports taking the medications on and off prior to admission but unsure to determine when was the last dose.  He reports he has been staying in [place] for the past 4 years as he studies at [employer] but drove home "my dad said I need to come home to get some help" he reported his dad was alerted "the way I was acting I had anger and paranoia" he reports feeling uncomfortable around others at work led to him quitting his job and he later notes "I got fired they said they were making changes" he reports no paranoia since here in the hospital.  He reports feeling hopeless and helpless prior to admission with passive SI but denies active SI intention or plan prior to admission or at this time, able to contract for  safety in the hospital.  He denies HI or AVH.  He reports feeling depressed on and off "forever since I was a kid" worsening over the past few weeks related to stress including breaking up from girlfriend of 6 months and also quitting his job or getting fired as noted previously.  He reports decreased sleep and appetite decreased interest and motivation decreased energy and concentration feeling hopeless and helpless on and off prior to admission with passive SI as noted.  He does describe some feeling uncomfortable around others or guardedness and paranoia on and off for about 6 months but worsening recently.  He denies symptoms consistent with mania or hypomania or PTSD.  "  Principal Problem: MDD (major depressive disorder), recurrent, severe, with psychosis (HCC) Discharge Diagnoses: Principal Problem:   MDD (major depressive disorder), recurrent, severe, with psychosis (HCC) Active Problems:   Anxiety disorder, unspecified   Past Psychiatric History: Prior Psychiatric diagnoses: Depression, anxiety and ADHD Past Psychiatric Hospitalizations: Denies   History of self mutilation: Denies Past suicide attempts: Denies Past history of HI, violent or aggressive behavior: Denies   Past Psychiatric medications trials: Lexapro, BuSpar, Vistaril and Adderall History of ECT/TMS: Denies   Outpatient psychiatric Follow up: Seeing a psychiatrist in [place] area for the past year with last visit in January Prior Outpatient Therapy: Was seeing a counselor at the same office with the psychiatric provider but noncompliant with visits per his report  Past Medical History:  Past Medical History:  Diagnosis Date   Medical history non-contributory    History reviewed. No  pertinent surgical history. Family History: History reviewed. No pertinent family history. Family Psychiatric  History:  Psychiatric illness: Denies Suicide: Denies Substance Abuse: Denies  Social History:  Social History    Substance and Sexual Activity  Alcohol Use Not Currently     Social History   Substance and Sexual Activity  Drug Use Not Currently    Social History   Socioeconomic History   Marital status: Single    Spouse name: Not on file   Number of children: Not on file   Years of education: Not on file   Highest education level: Not on file  Occupational History   Not on file  Tobacco Use   Smoking status: Never   Smokeless tobacco: Never  Substance and Sexual Activity   Alcohol use: Not Currently   Drug use: Not Currently   Sexual activity: Not Currently  Other Topics Concern   Not on file  Social History Narrative   Not on file   Social Drivers of Health   Financial Resource Strain: High Risk (12/13/2022)   Received from [facility]   Overall Financial Resource Strain (CARDIA)    Difficulty of Paying Living Expenses: Very hard  Food Insecurity: No Food Insecurity (01/05/2024)   Hunger Vital Sign    Worried About Running Out of Food in the Last Year: Never true    Ran Out of Food in the Last Year: Never true  Transportation Needs: No Transportation Needs (01/05/2024)   PRAPARE - Administrator, Civil Service (Medical): No    Lack of Transportation (Non-Medical): No  Physical Activity: Sufficiently Active (12/13/2022)   Received from [facility]   Exercise Vital Sign    Days of Exercise per Week: 6 days    Minutes of Exercise per Session: 120 min  Stress: Stress Concern Present (12/18/2022)   Received from [facility]   [employer] of Occupational Health - Occupational Stress Questionnaire    Feeling of Stress : Rather much  Social Connections: Socially Isolated (12/13/2022)   Received from [facility]   Social Connection and Isolation Panel [NHANES]    Frequency of Communication with Friends and Family: Three times a week    Frequency of Social Gatherings with Friends  and Family: Once a week    Attends Religious Services: Never    Database administrator or Organizations: No    Attends Banker Meetings: Never    Marital Status: Never married    Hospital Course:     Patient was admitted to inpatient psychiatry at [facility] for safety and stabilization. Patient was provided safe and therapeutic milieu, psychiatric and medical assessment, care and treatment, as well as support from nursing, behavioral health staff. Both psychotherapy and psychoeducation groups were provided. Different coping skills such as journaling, CBT and art therapy groups were offered. Additional consultation was provided by hospitalist for H&P and medical needs.  Patient was started on Prozac and Abilify during the admission for MDD with psychotic features. Patient tolerated without side effects and medications were titrated to therapeutic effect. As patient stabilized on medications and participated in therapeutic interventions, symptoms began to improve. Patient did not exhibit SI or HI or psychosis for >48 hours prior to discharge. Patient was voluntary and requested discharge and did not meet IVC criteria.  On the day of discharge, the chart was reviewed, case was discussed with staff and the patient was seen in  person. Patient's overall mood has improved and is euthymic. Denies depression or anxiety and reports "feeling good." Patient denies use of stimulant prior to admission but states prior to admission he had been depressed and paranoid. Denies any symptoms during hospitalization and has been compliant with Abilify and Prozac. Patient does not appear depressed or psychotic today. Patient was calm and cooperative and did not appear anxious. Patient reported adequate sleep and stable mood. Patient was tolerating medications well without side effects reported or noted. Patient denied suicidal ideation, plan or intent, denied hopelessness, helplessness or worthlessness, and denied  homicidal ideation. Insight and judgement have improved. Patient demonstrated future orientation and was motivated to follow-up with aftercare. Patient was encouraged to be adherent with medications. Patient was instructed to call 911, ask for help to go to the closest emergency room or crisis center, call crisis hotlines for help if in critical status or when symptoms were worsening. Patient voiced understanding of this information. At the time of discharge, patient had reached maximum benefit from hospitalization, was no longer considered to be dangerous to self or others, and was psychiatrically stable and otherwise appropriate for discharge to less restrictive care in the community.   Medical Hospital Course: Patient was seen by the hospitalist for routine admission examination. Medications for chronic conditions were continued.  Medical hospital course was otherwise unremarkable.    Physical Findings: AIMS: Facial and Oral Movements Muscles of Facial Expression: None Lips and Perioral Area: None Jaw: None Tongue: None,Extremity Movements Upper (arms, wrists, hands, fingers): None Lower (legs, knees, ankles, toes): None, Trunk Movements Neck, shoulders, hips: None, Global Judgements Severity of abnormal movements overall : None Incapacitation due to abnormal movements: None Patient's awareness of abnormal movements: No Awareness, Dental Status Current problems with teeth and/or dentures?: No Does patient usually wear dentures?: No [place]?: No  CIWA:    COWS:     Musculoskeletal: Strength & Muscle Tone: within normal limits Gait & Station: normal Patient leans: N/A   Psychiatric Specialty Exam:  Presentation  General Appearance:  Casual  Eye Contact: Fair  Speech: Clear and Coherent  Speech Volume: Normal  Handedness: Right   Mood and Affect  Mood: Euthymic  Affect: Appropriate   Thought Process  Thought Processes: Coherent  Descriptions of  Associations:Intact  Orientation:Full (Time, Place and Person)  Thought Content:Logical  History of Schizophrenia/Schizoaffective disorder:No  Duration of Psychotic Symptoms:No data recorded Hallucinations:Hallucinations: None  Ideas of Reference:None  Suicidal Thoughts:Suicidal Thoughts: No  Homicidal Thoughts:Homicidal Thoughts: No   Sensorium  Memory: Immediate Good; Recent Fair  Judgment: Fair  Insight: Fair   Art therapist  Concentration: Fair  Attention Span: Fair  Recall: [employer] of Knowledge: Fair  Language: Fair   Psychomotor Activity  Psychomotor Activity: Psychomotor Activity: Normal   Assets  Assets: Communication Skills; Desire for Improvement; Financial Resources/Insurance; Housing; Social Support; Transportation   Sleep  Sleep: Sleep: Fair Number of Hours of Sleep: 7    Physical Exam: Physical Exam ROS Blood pressure (!) 145/84, pulse 63, temperature 98.6 F (37 C), temperature source Oral, resp. rate 20, height 5\' 10"  (1.778 m), weight 79.7 kg, SpO2 100%. Body mass index is 25.22 kg/m.   Social History   Tobacco Use  Smoking Status Never  Smokeless Tobacco Never   Tobacco Cessation:  N/A, patient does not currently use tobacco products   Blood Alcohol level:  Lab Results  Component Value Date   ETH <10 01/04/2024    Metabolic Disorder Labs:  Lab Results  Component Value Date   HGBA1C 5.0 01/04/2024   MPG 96.8 01/04/2024   Lab Results  Component Value Date   PROLACTIN 13.9 01/04/2024   Lab Results  Component Value Date   CHOL 132 01/04/2024   TRIG 42 01/04/2024   HDL 49 01/04/2024   CHOLHDL 2.7 01/04/2024   VLDL 8 01/04/2024   LDLCALC 75 01/04/2024    See Psychiatric Specialty Exam and Suicide Risk Assessment completed by Attending Physician prior to discharge.  Discharge destination:  Home  Is patient on multiple antipsychotic therapies at discharge:  No   Has Patient had three or  more failed trials of antipsychotic monotherapy by history:  No  Recommended Plan for Multiple Antipsychotic Therapies: NA   Allergies as of 01/08/2024   No Known Allergies      Medication List     STOP taking these medications    hydrocortisone 2.5 % cream   hydrOXYzine 25 MG capsule Commonly known as: VISTARIL   MINERAL OIL-HYDROPHIL PETROLAT EX   montelukast 10 MG tablet Commonly known as: SINGULAIR   triamcinolone ointment 0.1 % Commonly known as: KENALOG       TAKE these medications      Indication  amphetamine-dextroamphetamine 25 MG 24 hr capsule Commonly known as: ADDERALL XR Take 25 mg by mouth daily as needed (as needed ADHD).  Indication: Attention Deficit Hyperactivity Disorder   amphetamine-dextroamphetamine 5 MG tablet Commonly known as: ADDERALL Take 5 mg by mouth daily as needed (as needed for ADHD).  Indication: Attention Deficit Hyperactivity Disorder   ARIPiprazole 5 MG tablet Commonly known as: ABILIFY Take 1 tablet (5 mg total) by mouth daily.  Indication: Major Depressive Disorder   FLUoxetine 20 MG capsule Commonly known as: PROZAC Take 1 capsule (20 mg total) by mouth daily.  Indication: Major Depressive Disorder   hydrOXYzine 25 MG tablet Commonly known as: ATARAX Take 1 tablet (25 mg total) by mouth 3 (three) times daily as needed for anxiety.  Indication: Feeling Anxious   nicotine polacrilex 2 MG gum Commonly known as: NICORETTE Take 1 each (2 mg total) by mouth as needed for smoking cessation.  Indication: Nicotine Addiction   traZODone 50 MG tablet Commonly known as: DESYREL Take 1 tablet (50 mg total) by mouth at bedtime as needed for sleep.  Indication: Trouble Sleeping        Follow-up Information     [facility]. Go on 01/10/2024.   Why: appointment is at 11am with Alycia Rossetti. Please call the office at [phone] to provide them with updated information and to confirm the appontment on  April 9, [address]25 between 10a-2p Contact information: [address] [place], [place] [phone]                Follow-up recommendations:  Other:  outpatient psychiatric followup with [facility] on 01/10/24 Continue Prozac 20 mg qdaily, Abilify 5 mg qdaily, abstain from stimulant if possible which was discussed with patient as can potential increase anxiety, insomnia, paranoia although patient denies these effects thus can consider restarting with close observation  Follow-up Information       [facility]. Go on 01/10/2024.   Why: appointment is at 11am with Alycia Rossetti. Please call the office at [phone] to provide them with updated information and to confirm the appontment on April 9, [address] between 10a-2p Contact information: [address] [place], [place] [phone]       Signed: Miguel Rota, MD 01/08/2024, 11:56  AM

## 2024-01-08 NOTE — Group Note (Unsigned)
 Date:  01/08/2024 Time:  10:07 AM  Group Topic/Focus:  Goals Group:   The focus of this group is to help patients establish daily goals to achieve during treatment and discuss how the patient can incorporate goal setting into their daily lives to aide in recovery.     Participation Level:  {BHH PARTICIPATION ZOXWR:60454}  Participation Quality:  {BHH PARTICIPATION QUALITY:22265}  Affect:  {BHH AFFECT:22266}  Cognitive:  {BHH COGNITIVE:22267}  Insight: {BHH Insight2:20797}  Engagement in Group:  {BHH ENGAGEMENT IN UJWJX:91478}  Modes of Intervention:  {BHH MODES OF INTERVENTION:22269}  Additional Comments:  ***  Estill Dooms 01/08/2024, 10:07 AM

## 2024-01-08 NOTE — Discharge Instructions (Addendum)
 [facility] appointment on 01/10/24  Follow-up Information       [facility]. Go on 01/10/2024.   Why: appointment is at 11am with Alycia Rossetti. Please call the office at [phone] to provide them with updated information and to confirm the appontment on April 9, [address] between 10a-2p Contact information: [address] [place], [place] [phone]

## 2024-01-08 NOTE — Group Note (Unsigned)
 Date:  01/08/2024 Time:  11:00 AM  Group Topic/Focus:  Goals Group:   The focus of this group is to help patients establish daily goals to achieve during treatment and discuss how the patient can incorporate goal setting into their daily lives to aide in recovery.     Participation Level:  {BHH PARTICIPATION ZOXWR:60454}  Participation Quality:  {BHH PARTICIPATION QUALITY:22265}  Affect:  {BHH AFFECT:22266}  Cognitive:  {BHH COGNITIVE:22267}  Insight: {BHH Insight2:20797}  Engagement in Group:  {BHH ENGAGEMENT IN UJWJX:91478}  Modes of Intervention:  {BHH MODES OF INTERVENTION:22269}  Additional Comments:  ***  Estill Dooms 01/08/2024, 11:00 AM

## 2024-01-08 NOTE — Group Note (Unsigned)
 Date:  01/08/2024 Time:  10:16 AM  Group Topic/Focus:  Goals Group:   The focus of this group is to help patients establish daily goals to achieve during treatment and discuss how the patient can incorporate goal setting into their daily lives to aide in recovery.     Participation Level:  {BHH PARTICIPATION ZOXWR:60454}  Participation Quality:  {BHH PARTICIPATION QUALITY:22265}  Affect:  {BHH AFFECT:22266}  Cognitive:  {BHH COGNITIVE:22267}  Insight: {BHH Insight2:20797}  Engagement in Group:  {BHH ENGAGEMENT IN UJWJX:91478}  Modes of Intervention:  {BHH MODES OF INTERVENTION:22269}  Additional Comments:  ***  Estill Dooms 01/08/2024, 10:16 AM

## 2024-01-08 NOTE — Group Note (Signed)
 Date:  01/08/2024 Time:  11:13 AM  Group Topic/Focus:  Goals Group:   The focus of this group is to help patients establish daily goals to achieve during treatment and discuss how the patient can incorporate goal setting into their daily lives to aide in recovery.    Participation Level:  Active  Participation Quality:  Attentive  Affect:  Appropriate  Cognitive:  Alert and Appropriate  Insight: Good  Engagement in Group:  Engaged  Modes of Intervention:  Education  Additional Comments:    Estill Dooms 01/08/2024, 11:13 AM

## 2024-01-08 NOTE — BHH Suicide Risk Assessment (Signed)
 [facility] Discharge Suicide Risk Assessment   Principal Problem: MDD (major depressive disorder), recurrent, severe, with psychosis (HCC) Discharge Diagnoses: Principal Problem:   MDD (major depressive disorder), recurrent, severe, with psychosis (HCC) Active Problems:   Anxiety disorder, unspecified   Total Time spent with patient: 15 minutes  Musculoskeletal: Strength & Muscle Tone: within normal limits Gait & Station: normal Patient leans: N/A  Psychiatric Specialty Exam  Presentation  General Appearance:  Casual  Eye Contact: Fair  Speech: Clear and Coherent  Speech Volume: Normal  Handedness: Right   Mood and Affect  Mood: Euthymic  Duration of Depression Symptoms: Greater than two weeks  Affect: Appropriate   Thought Process  Thought Processes: Coherent  Descriptions of Associations:Intact  Orientation:Full (Time, Place and Person)  Thought Content:Logical  History of Schizophrenia/Schizoaffective disorder:No  Duration of Psychotic Symptoms:No data recorded Hallucinations:Hallucinations: None  Ideas of Reference:None  Suicidal Thoughts:Suicidal Thoughts: No  Homicidal Thoughts:Homicidal Thoughts: No   Sensorium  Memory: Immediate Good; Recent Fair  Judgment: Fair  Insight: Fair   Art therapist  Concentration: Fair  Attention Span: Fair  Recall: [employer] of Knowledge: Fair  Language: Fair   Psychomotor Activity  Psychomotor Activity:Psychomotor Activity: Normal   Assets  Assets: Manufacturing systems engineer; Desire for Improvement; Financial Resources/Insurance; Housing; Social Support; Transportation   Sleep  Sleep:Sleep: Fair Number of Hours of Sleep: 7   Physical Exam: Physical Exam ROS Blood pressure (!) 145/84, pulse 63, temperature 98.6 F (37 C), temperature source Oral, resp. rate 20, height 5\' 10"  (1.778 m), weight 79.7 kg, SpO2 100%. Body mass index is 25.22 kg/m.  Mental Status Per Nursing  Assessment::   On Admission:  NA  Demographic Factors:  Male and NA  Loss Factors: NA  Historical Factors: NA  Risk Reduction Factors:   Sense of responsibility to family, Religious beliefs about death, Employed, Living with another person, especially a relative, Positive social support, Positive therapeutic relationship, and Positive coping skills or problem solving skills  Continued Clinical Symptoms:  Previous Psychiatric Diagnoses and Treatments  Cognitive Features That Contribute To Risk:  None    Suicide Risk:  Minimal: No identifiable suicidal ideation.  Patients presenting with no risk factors but with morbid ruminations; may be classified as minimal risk based on the severity of the depressive symptoms  Patient denies SI or HI for >48 hours. Denies wanting to be dead and future oriented. SRA complete and acute risk for suicide is low.   [place] Suicide Risk assessment:  1. Do you wish to be dead? NONE REPORTED 2. Have you wished your dead or wished you could go to sleep and not wake up? NONE REPORTED 3.  Have you actually had thoughts of killing yourself?  NONE REPORTED 4.  Have you been thinking about how you might do this?  NONE REPORTED 5.  Have you had these thoughts and some intention of acting on them? NONE REPORTED 6.  Have you started to work out or worked out the details to kill yourself? NONE REPORTED 7.  Do you intend to carry out this plan? NONE REPORTED 8. On a scale of 1-5 with 1 being the least severe and 5 being the most severe answer the following questions place for intensity of ideation. ZERO 9. How many times have you had these thoughts? NONE REPORTED 10. When you have the thoughts how long to the last?  NONE REPORTED 11. Control ability.  Could you or can you stop thinking about killing herself or wanting to  die if you want to?  YES 12. Are there any things anyone or anything family religion pain of death that stop you from wanting to die or acting  on thoughts of committing suicide?  FAMILY 13.  What sort of reason to do have to think about wanting to die or killing yourself? NONE REPORTED 14.Was it to end the pain or stop the way you are feeling in other words you could not go on living with his pain or how you are feeling or was not to get attention revenge or reaction from others?  Or both?  NONE REPORTED  [place] Suicide Risk assessment:  1. Do you wish to be dead? NONE REPORTED 2. Have you wished your dead or wished you could go to sleep and not wake up? NONE REPORTED 3.  Have you actually had thoughts of killing yourself?  NONE REPORTED 4.  Have you been thinking about how you might do this?  NONE REPORTED 5.  Have you had these thoughts and some intention of acting on them? NONE REPORTED 6.  Have you started to work out or worked out the details to kill yourself? NONE REPORTED 7.  Do you intend to carry out this plan? NONE REPORTED 8. On a scale of 1-5 with 1 being the least severe and 5 being the most severe answer the following questions place for intensity of ideation. ZERO 9. How many times have you had these thoughts? NONE REPORTED 10. When you have the thoughts how long to the last?  NONE REPORTED 11. Control ability.  Could you or can you stop thinking about killing herself or wanting to die if you want to?  YES 12. Are there any things anyone or anything family religion pain of death that stop you from wanting to die or acting on thoughts of committing suicide?  FAMILY 13.  What sort of reason to do have to think about wanting to die or killing yourself? NONE REPORTED 14.Was it to end the pain or stop the way you are feeling in other words you could not go on living with his pain or how you are feeling or was not to get attention revenge or reaction from others?  Or both?  NONE REPORTED    Follow-up Information     [facility]. Go on 01/10/2024.   Why: appointment is at 11am with Alycia Rossetti. Please call  the office at [phone] to provide them with updated information and to confirm the appontment on April 9, [address] between 10a-2p Contact information: [address] [place], [place] [phone]                Plan Of Care/Follow-up recommendations:  Other:  outpatient psychiatric followup Continue Prozac 20 mg qdaily, Abilify 5 mg qdaily See discharge summary Miguel Rota, MD 01/08/2024, 11:53 AM
